# Patient Record
Sex: Male | Born: 1975
Health system: Southern US, Community
[De-identification: ages and names within clinical notes are randomized; demographics above are authoritative.]

## PROBLEM LIST (undated history)

## (undated) DIAGNOSIS — K649 Unspecified hemorrhoids: Secondary | ICD-10-CM

## (undated) DIAGNOSIS — I10 Essential (primary) hypertension: Secondary | ICD-10-CM

## (undated) DIAGNOSIS — L0232 Furuncle of buttock: Secondary | ICD-10-CM

## (undated) DIAGNOSIS — E119 Type 2 diabetes mellitus without complications: Secondary | ICD-10-CM

## (undated) HISTORY — PX: TONSILLECTOMY: SUR1361

---

## 2001-05-18 ENCOUNTER — Emergency Department (HOSPITAL_COMMUNITY): Admission: EM | Admit: 2001-05-18 | Discharge: 2001-05-18 | Payer: Self-pay | Admitting: Emergency Medicine

## 2001-05-18 ENCOUNTER — Encounter: Payer: Self-pay | Admitting: Emergency Medicine

## 2005-09-23 ENCOUNTER — Emergency Department (HOSPITAL_COMMUNITY): Admission: EM | Admit: 2005-09-23 | Discharge: 2005-09-23 | Payer: Self-pay | Admitting: Emergency Medicine

## 2008-05-24 ENCOUNTER — Emergency Department (HOSPITAL_COMMUNITY): Admission: EM | Admit: 2008-05-24 | Discharge: 2008-05-24 | Payer: Self-pay | Admitting: Emergency Medicine

## 2008-05-26 ENCOUNTER — Emergency Department (HOSPITAL_COMMUNITY): Admission: EM | Admit: 2008-05-26 | Discharge: 2008-05-26 | Payer: Self-pay | Admitting: Emergency Medicine

## 2008-05-31 ENCOUNTER — Emergency Department (HOSPITAL_COMMUNITY): Admission: EM | Admit: 2008-05-31 | Discharge: 2008-05-31 | Payer: Self-pay | Admitting: Family Medicine

## 2009-09-18 ENCOUNTER — Emergency Department (HOSPITAL_COMMUNITY): Admission: EM | Admit: 2009-09-18 | Discharge: 2009-09-18 | Payer: Self-pay | Admitting: Emergency Medicine

## 2010-12-22 DIAGNOSIS — L0232 Furuncle of buttock: Secondary | ICD-10-CM

## 2010-12-22 HISTORY — DX: Furuncle of buttock: L02.32

## 2011-09-18 LAB — CBC
HCT: 43.7
Hemoglobin: 15.1
MCHC: 34.5
MCV: 86.1
Platelets: 174
RBC: 5.08
RDW: 13.9
WBC: 10.6 — ABNORMAL HIGH

## 2011-09-18 LAB — DIFFERENTIAL
Basophils Absolute: 0
Basophils Relative: 0
Eosinophils Absolute: 0.3
Eosinophils Relative: 2
Lymphocytes Relative: 19
Lymphs Abs: 2
Monocytes Absolute: 0.5
Monocytes Relative: 4
Neutro Abs: 7.9 — ABNORMAL HIGH
Neutrophils Relative %: 74

## 2011-09-18 LAB — POCT I-STAT, CHEM 8
BUN: 10
Calcium, Ion: 1.19
Chloride: 102
Creatinine, Ser: 1
Glucose, Bld: 460 — ABNORMAL HIGH
HCT: 47
Hemoglobin: 16
Potassium: 4
Sodium: 130 — ABNORMAL LOW
TCO2: 13

## 2011-09-18 LAB — HEMOGLOBIN A1C
Hgb A1c MFr Bld: 17.1 — ABNORMAL HIGH
Mean Plasma Glucose: 531

## 2012-08-13 ENCOUNTER — Other Ambulatory Visit: Payer: Self-pay | Admitting: Family Medicine

## 2012-08-24 ENCOUNTER — Other Ambulatory Visit: Payer: Self-pay | Admitting: Family Medicine

## 2012-11-16 ENCOUNTER — Observation Stay (HOSPITAL_COMMUNITY)
Admission: EM | Admit: 2012-11-16 | Discharge: 2012-11-16 | Disposition: A | Discharge status: Home / Self Care | Payer: Self-pay | Attending: Emergency Medicine | Admitting: Emergency Medicine

## 2012-11-16 ENCOUNTER — Encounter (HOSPITAL_COMMUNITY): Payer: Self-pay | Admitting: Emergency Medicine

## 2012-11-16 DIAGNOSIS — F17200 Nicotine dependence, unspecified, uncomplicated: Secondary | ICD-10-CM | POA: Insufficient documentation

## 2012-11-16 DIAGNOSIS — E1169 Type 2 diabetes mellitus with other specified complication: Principal | ICD-10-CM | POA: Insufficient documentation

## 2012-11-16 DIAGNOSIS — R739 Hyperglycemia, unspecified: Secondary | ICD-10-CM

## 2012-11-16 HISTORY — DX: Type 2 diabetes mellitus without complications: E11.9

## 2012-11-16 LAB — COMPREHENSIVE METABOLIC PANEL
ALT: 20 U/L (ref 0–53)
Albumin: 4.5 g/dL (ref 3.5–5.2)
Alkaline Phosphatase: 75 U/L (ref 39–117)
Potassium: 4.6 mEq/L (ref 3.5–5.1)
Sodium: 130 mEq/L — ABNORMAL LOW (ref 135–145)
Total Protein: 8.3 g/dL (ref 6.0–8.3)

## 2012-11-16 LAB — CBC WITH DIFFERENTIAL/PLATELET
Basophils Absolute: 0.1 10*3/uL (ref 0.0–0.1)
Eosinophils Relative: 1 % (ref 0–5)
HCT: 42 % (ref 39.0–52.0)
Lymphocytes Relative: 33 % (ref 12–46)
MCHC: 34.3 g/dL (ref 30.0–36.0)
MCV: 84.8 fL (ref 78.0–100.0)
Monocytes Absolute: 0.6 10*3/uL (ref 0.1–1.0)
RDW: 13.1 % (ref 11.5–15.5)
WBC: 7.4 10*3/uL (ref 4.0–10.5)

## 2012-11-16 LAB — GLUCOSE, CAPILLARY: Glucose-Capillary: 458 mg/dL — ABNORMAL HIGH (ref 70–99)

## 2012-11-16 MED ORDER — SODIUM CHLORIDE 0.9 % IV SOLN
1000.0000 mL | INTRAVENOUS | Status: DC
  Administered 2012-11-16: 1000 mL via INTRAVENOUS

## 2012-11-16 MED ORDER — INSULIN ASPART 100 UNIT/ML ~~LOC~~ SOLN
7.5000 [IU] | Freq: Once | SUBCUTANEOUS | Status: AC
  Administered 2012-11-16: 7.5 [IU] via INTRAVENOUS
  Filled 2012-11-16: qty 1

## 2012-11-16 MED ORDER — SODIUM CHLORIDE 0.9 % IV SOLN
1000.0000 mL | Freq: Once | INTRAVENOUS | Status: AC
  Administered 2012-11-16: 1000 mL via INTRAVENOUS

## 2012-11-16 MED ORDER — ONDANSETRON HCL 4 MG/2ML IJ SOLN: 4.0000 mg | Freq: Three times a day (TID) | INTRAMUSCULAR | Status: DC | PRN

## 2012-11-16 MED ORDER — METFORMIN HCL 500 MG PO TABS: 500.0000 mg | ORAL_TABLET | Freq: Two times a day (BID) | ORAL | Status: DC

## 2012-11-16 MED ORDER — SODIUM CHLORIDE 0.9 % IV SOLN
INTRAVENOUS | Status: DC
  Filled 2012-11-16: qty 1

## 2012-11-16 NOTE — ED Notes (Signed)
Pt denies any nausea, vomiting or abdominal pain; pt c/o generalized weakness that started about a month ago; pt has not had any diabetic medicine since [facility] closed; pt also reports not having a prescription for metformin and states this is the reason why he is not taking any medicine;

## 2012-11-16 NOTE — ED Notes (Signed)
Report called to Becky, RN

## 2012-11-16 NOTE — ED Notes (Signed)
Patient CBG was 353 Nurse Haig Prophet was informed.

## 2012-11-16 NOTE — ED Notes (Signed)
No answer x1

## 2012-11-16 NOTE — ED Notes (Signed)
Has been out of sugar meds x 1 month states is voiding a lot and is really thirsty cannot wait for insurance to kick in

## 2012-11-16 NOTE — ED Provider Notes (Signed)
History     CSN: [id number]  Arrival date & time 11/16/12  1443   First MD Initiated Contact with Patient 11/16/12 1544      No chief complaint on file.   (Consider location/radiation/quality/duration/timing/severity/associated sxs/prior treatment) HPI  Frederick Grant is a 36 y.o. male complaining of polyuria,polydipsia and dehydration worsening over the course of the last 2 weeks. Patient has been out of his metformin for one month. He does not have a prescription and was a [facility] patient. Patient denies any abdominal pain, nausea/vomiting, chest pain, shortness of breath, palpitations.  Past Medical History  Diagnosis Date  . Diabetes mellitus without complication     No past surgical history on file.  No family history on file.  History  Substance Use Topics  . Smoking status: Current Every Day Smoker  . Smokeless tobacco: Not on file  . Alcohol Use: Yes      Review of Systems  Constitutional: Negative for fever.  Respiratory: Negative for shortness of breath.   Cardiovascular: Negative for chest pain.  Gastrointestinal: Negative for nausea, vomiting, abdominal pain and diarrhea.  Genitourinary: Positive for frequency.  All other systems reviewed and are negative.    Allergies  Review of patient's allergies indicates no known allergies.  Home Medications  No current outpatient prescriptions on file.  BP 142/92  Pulse 113  Temp 98.6 F (37 C)  Resp 16  SpO2 99%  Physical Exam  Nursing note and vitals reviewed. Constitutional: He is oriented to person, place, and time. He appears well-developed and well-nourished. No distress.       Obese  HENT:  Head: Normocephalic.       Dry mucous membrane  Eyes: Conjunctivae normal and EOM are normal.  Cardiovascular: Normal rate.        Tachycardia in the 110s no murmurs rubs or gallops.  Pulmonary/Chest: Effort normal and breath sounds normal. No stridor. No respiratory distress. He has no wheezes. He  has no rales. He exhibits no tenderness.  Abdominal: Soft. Bowel sounds are normal. He exhibits no distension and no mass. There is no tenderness. There is no rebound and no guarding.  Musculoskeletal: Normal range of motion. He exhibits no edema.  Neurological: He is alert and oriented to person, place, and time.  Psychiatric: He has a normal mood and affect.    ED Course  Procedures (including critical care time)  Labs Reviewed  COMPREHENSIVE METABOLIC PANEL - Abnormal; Notable for the following:    Sodium 130 (*)     Chloride 93 (*)     Glucose, Bld 470 (*)     GFR calc non Af Amer 88 (*)     All other components within normal limits  GLUCOSE, CAPILLARY - Abnormal; Notable for the following:    Glucose-Capillary 458 (*)     All other components within normal limits  CBC WITH DIFFERENTIAL   No results found.   1. Hyperglycemia without ketosis       MDM  Hyperglycemia with CBG of 470 normal gap of 13. Physical exam is normal and patient has no other complaints at this time. Patient will be started on glucose stabilizer protocol and transferred to the CDU. Report given to NP. Smith.           Wynetta Emery, PA-C 11/16/12 2111

## 2012-11-16 NOTE — ED Provider Notes (Signed)
Patient moved to CDU pending completion of treatment of hyperglycemia.  Patient feeling better after initial fluid bolus with decreased in blood glucose to 289.  Patient fed and carbohydrates covered with insulin.  Patient ran out of his metformin about one month ago (was [facility], has not been able to arrange for prescription refill.  Patient states his insurance will become effective at the first of the new year.  Patient provided a prescription for metformin 500 mg bid, with one refill.    Jimmye Norman, NP 11/16/12 [phone]

## 2012-11-16 NOTE — ED Provider Notes (Signed)
Medical screening examination/treatment/procedure(s) were performed by non-physician practitioner and as supervising physician I was immediately available for consultation/collaboration.  Rogene Meth, MD 11/16/12 2131 

## 2012-11-16 NOTE — ED Notes (Signed)
PA at bedside.

## 2012-11-16 NOTE — ED Notes (Addendum)
Pt A.O.x  4. Vitals stable. NAD. Resp WNL. Skin warm and dry.  Denies pain.  Ambulatory. Steady gait with no assist. Family at bedside. Verbalized need to full prescription. Verbalized understanding of medication administration. No further needs at this time.

## 2012-11-17 NOTE — ED Provider Notes (Signed)
Medical screening examination/treatment/procedure(s) were performed by non-physician practitioner and as supervising physician I was immediately available for consultation/collaboration.  Geoffery Lyons, MD 11/17/12 [phone]

## 2013-07-13 ENCOUNTER — Encounter (HOSPITAL_COMMUNITY): Payer: Self-pay

## 2013-07-13 ENCOUNTER — Emergency Department (HOSPITAL_COMMUNITY)
Admission: EM | Admit: 2013-07-13 | Discharge: 2013-07-13 | Disposition: A | Discharge status: Home / Self Care | Payer: BC Managed Care – PPO | Source: Home / Self Care | Attending: Family Medicine | Admitting: Family Medicine

## 2013-07-13 DIAGNOSIS — E119 Type 2 diabetes mellitus without complications: Secondary | ICD-10-CM

## 2013-07-13 MED ORDER — METFORMIN HCL 1000 MG PO TABS: 1000.0000 mg | ORAL_TABLET | Freq: Two times a day (BID) | ORAL | Status: DC

## 2013-07-13 NOTE — ED Notes (Signed)
Chart review.

## 2013-07-13 NOTE — ED Notes (Signed)
Out of metformin, needs refill

## 2013-07-13 NOTE — ED Provider Notes (Signed)
   History    CSN: [id number] Arrival date & time 07/13/13  1150  First MD Initiated Contact with Patient 07/13/13 1317     Chief Complaint  Patient presents with  . Medication Refill   (Consider location/radiation/quality/duration/timing/severity/associated sxs/prior Treatment) Patient is a 37 y.o. male presenting with hyperglycemia. The history is provided by the patient.  Hyperglycemia Severity:  Mild Chronicity:  Chronic Diabetes status:  Controlled with oral medications Current diabetic therapy:  Metformin out for 2 wks. Context: noncompliance   Risk factors: obesity    Past Medical History  Diagnosis Date  . Diabetes mellitus without complication    History reviewed. No pertinent past surgical history. History reviewed. No pertinent family history. History  Substance Use Topics  . Smoking status: Current Every Day Smoker  . Smokeless tobacco: Not on file  . Alcohol Use: Yes    Review of Systems  Constitutional: Negative.     Allergies  Review of patient's allergies indicates no known allergies.  Home Medications   Current Outpatient Rx  Name  Route  Sig  Dispense  Refill  . metFORMIN (GLUCOPHAGE) 1000 MG tablet   Oral   Take 1 tablet (1,000 mg total) by mouth 2 (two) times daily.   60 tablet   0   . metFORMIN (GLUCOPHAGE) 500 MG tablet   Oral   Take 1 tablet (500 mg total) by mouth 2 (two) times daily with a meal.   60 tablet   1    BP 145/97  Pulse 98  Temp(Src) 98.1 F (36.7 C) (Oral)  Resp 20  SpO2 95% Physical Exam  Nursing note and vitals reviewed. Constitutional: He is oriented to person, place, and time. He appears well-developed and well-nourished.  Neck: Normal range of motion. Neck supple.  Cardiovascular: Normal rate, normal heart sounds and intact distal pulses.   Pulmonary/Chest: Effort normal and breath sounds normal.  Neurological: He is alert and oriented to person, place, and time.  Skin: Skin is warm and dry.    ED  Course  Procedures (including critical care time) Labs Reviewed  GLUCOSE, CAPILLARY - Abnormal; Notable for the following:    Glucose-Capillary 489 (*)    All other components within normal limits   No results found. 1. Diabetes mellitus     MDM  [id number]  Linna Hoff, MD 07/13/13 1339

## 2013-08-04 DIAGNOSIS — I10 Essential (primary) hypertension: Secondary | ICD-10-CM | POA: Insufficient documentation

## 2013-08-04 DIAGNOSIS — E119 Type 2 diabetes mellitus without complications: Secondary | ICD-10-CM | POA: Insufficient documentation

## 2014-02-06 DIAGNOSIS — E785 Hyperlipidemia, unspecified: Secondary | ICD-10-CM | POA: Insufficient documentation

## 2014-05-04 ENCOUNTER — Encounter (HOSPITAL_COMMUNITY): Payer: Self-pay | Admitting: Emergency Medicine

## 2014-05-04 ENCOUNTER — Emergency Department (HOSPITAL_COMMUNITY)
Admission: EM | Admit: 2014-05-04 | Discharge: 2014-05-04 | Disposition: A | Discharge status: Home / Self Care | Payer: BC Managed Care – PPO | Source: Home / Self Care | Attending: Family Medicine | Admitting: Family Medicine

## 2014-05-04 DIAGNOSIS — S61409A Unspecified open wound of unspecified hand, initial encounter: Secondary | ICD-10-CM

## 2014-05-04 DIAGNOSIS — L089 Local infection of the skin and subcutaneous tissue, unspecified: Secondary | ICD-10-CM

## 2014-05-04 DIAGNOSIS — S61452A Open bite of left hand, initial encounter: Principal | ICD-10-CM

## 2014-05-04 DIAGNOSIS — W540XXA Bitten by dog, initial encounter: Secondary | ICD-10-CM

## 2014-05-04 HISTORY — DX: Essential (primary) hypertension: I10

## 2014-05-04 MED ORDER — CEFTRIAXONE SODIUM 250 MG IJ SOLR
1000.0000 mg | Freq: Once | INTRAMUSCULAR | Status: AC
  Administered 2014-05-04: 1000 mg via INTRAMUSCULAR

## 2014-05-04 MED ORDER — TETANUS-DIPHTH-ACELL PERTUSSIS 5-2.5-18.5 LF-MCG/0.5 IM SUSP: 0.5000 mL | Freq: Once | INTRAMUSCULAR | Status: DC

## 2014-05-04 MED ORDER — AMOXICILLIN-POT CLAVULANATE 875-125 MG PO TABS: 1.0000 | ORAL_TABLET | Freq: Two times a day (BID) | ORAL | Status: DC

## 2014-05-04 MED ORDER — TETANUS-DIPHTH-ACELL PERTUSSIS 5-2.5-18.5 LF-MCG/0.5 IM SUSP
INTRAMUSCULAR | Status: AC
  Filled 2014-05-04: qty 0.5

## 2014-05-04 MED ORDER — CEFTRIAXONE SODIUM 1 G IJ SOLR
INTRAMUSCULAR | Status: AC
  Filled 2014-05-04: qty 10

## 2014-05-04 NOTE — ED Provider Notes (Signed)
CSN: [id number]     Arrival date & time 05/04/14  0827 History   First MD Initiated Contact with Patient 05/04/14 [phone]     Chief Complaint  Patient presents with  . Animal Bite   (Consider location/radiation/quality/duration/timing/severity/associated sxs/prior Treatment) HPI Comments: PCP: Nadyne CoombesKaren Richter  Patient is a 38 y.o. male presenting with animal bite. The history is provided by the patient.  Animal Bite Contact animal:  Dog Location:  Hand Hand injury location:  L hand Time since incident:  1 day (+occurred yesterday midday) Incident location:  Home Notification: [facility] Animal's rabies vaccination status:  Up to date Animal in possession: yes   Tetanus status:  Up to date (had booster in 2014) Associated symptoms: swelling   Associated symptoms: no fever, no numbness and no rash   Associated symptoms comment:  +progressive swelling and redness over past 24 hours   Past Medical History  Diagnosis Date  . Diabetes mellitus without complication   . Hypertension    History reviewed. No pertinent past surgical history. History reviewed. No pertinent family history. History  Substance Use Topics  . Smoking status: Current Every Day Smoker  . Smokeless tobacco: Not on file  . Alcohol Use: Yes    Review of Systems  Constitutional: Negative for fever.  Skin: Negative for rash.  Neurological: Negative for numbness.  All other systems reviewed and are negative.   Allergies  Review of patient's allergies indicates no known allergies.  Home Medications   Prior to Admission medications   Medication Sig Start Date End Date Taking? Authorizing Provider  LISINOPRIL PO Take by mouth.   Yes Historical Provider, MD  amoxicillin-clavulanate (AUGMENTIN) 875-125 MG per tablet Take 1 tablet by mouth every 12 (twelve) hours. 05/04/14   Ardis RowanJennifer Lee Ripley Lovecchio, PA  metFORMIN (GLUCOPHAGE) 1000 MG tablet Take 1 tablet (1,000 mg total) by mouth 2 (two)  times daily. 07/13/13   Linna HoffJames D Kindl, MD  metFORMIN (GLUCOPHAGE) 500 MG tablet Take 1 tablet (500 mg total) by mouth 2 (two) times daily with a meal. 11/16/12   Jimmye Normanavid John Smith, NP   BP 166/99  Pulse 90  Temp(Src) 98.8 F (37.1 C) (Oral)  Resp 20  SpO2 100% Physical Exam  Nursing note and vitals reviewed. Constitutional: He is oriented to person, place, and time. He appears well-developed and well-nourished. No distress.  HENT:  Head: Normocephalic and atraumatic.  Eyes: Conjunctivae are normal. No scleral icterus.  Cardiovascular: Normal rate.   Pulmonary/Chest: Effort normal.  Musculoskeletal: Normal range of motion.  Neurological: He is alert and oriented to person, place, and time.  Skin: Skin is warm and dry.  1.5 cm SQ linear laceration over palmar surface at base of left thumb.    Psychiatric: He has a normal mood and affect. His behavior is normal.    ED Course  Procedures (including critical care time) Labs Review Labs Reviewed - No data to display  Imaging Review No results found.   MDM   1. Dog bite of left hand with infection   [facility] Given current signs of infection and that laceration occurred > 12 hours PTA, wound left open to heal by secondary intention. [facility] and patient instructed regarding wound care at home. Will be treated at home with Augmentin and [facility] days.  Jess BartersJennifer Lee ShagelukPresson, GeorgiaPA 05/04/14 1134

## 2014-05-04 NOTE — ED Provider Notes (Signed)
Medical screening examination/treatment/procedure(s) were performed by a resident physician or non-physician practitioner and as the supervising physician I was immediately available for consultation/collaboration.  Clementeen GrahamEvan Cheridan Kibler, MD    Rodolph BongEvan S Mirtha Jain, MD 05/04/14 [phone]

## 2014-05-04 NOTE — ED Notes (Signed)
Pt  Was bitten  l  Hand  By  His  Dog  Yesterday      While  Untangling  His  Leash         He  States the  Dog is  Chained  And  Is  utd  On his  Immunizations

## 2014-05-04 NOTE — Discharge Instructions (Signed)
Please wash wound 2 x day with warm soapy water, pat dry and keep covered during daytime with clean, dry dressing. Leave open to the air at night while sleeping. Medications as directed for infection and [facility]  Animal Bite An animal bite can result in a scratch on the skin, deep open cut, puncture of the skin, crush injury, or tearing away of the skin or a body part. Dogs are responsible for most animal bites. Children are bitten more often than adults. An animal bite can range from very mild to more serious. A small bite from your house pet is no cause for alarm. However, some animal bites can become infected or injure a bone or other tissue. You must seek medical care if:  The skin is broken and bleeding does not slow down or stop after 15 minutes.  The puncture is deep and difficult to clean (such as a cat bite).  Pain, warmth, redness, or pus develops around the wound.  The bite is from a stray animal or rodent. There may be a risk of rabies infection.  The bite is from a snake, raccoon, skunk, fox, coyote, or bat. There may be a risk of rabies infection.  The person bitten has a chronic illness such as diabetes, liver disease, or cancer, or the person takes medicine that lowers the immune system.  There is concern about the location and severity of the bite. It is important to clean and protect an animal bite wound right away to prevent infection. Follow these steps:  Clean the wound with plenty of water and soap.  Apply an antibiotic cream.  Apply gentle pressure over the wound with a clean towel or gauze to slow or stop bleeding.  Elevate the affected area above the heart to help stop any bleeding.  Seek medical care. Getting medical care within 8 hours of the animal bite leads to the best possible outcome. DIAGNOSIS  Your caregiver will most likely:  Take a detailed history of the animal and the bite injury.  Perform a wound exam.  Take  your medical history. Blood tests or X-rays may be performed. Sometimes, infected bite wounds are cultured and sent to a lab to identify the infectious bacteria.  TREATMENT  Medical treatment will depend on the location and type of animal bite as well as the patient's medical history. Treatment may include:  Wound care, such as cleaning and flushing the wound with saline solution, bandaging, and elevating the affected area.  Antibiotics.  Tetanus immunization.  Rabies immunization.  Leaving the wound open to heal. This is often done with animal bites, due to the high risk of infection. However, in certain cases, wound closure with stitches, wound adhesive, skin adhesive strips, or staples may be used. Infected bites that are left untreated may require intravenous (IV) antibiotics and surgical treatment in the hospital. HOME CARE INSTRUCTIONS  Follow your caregiver's instructions for wound care.  Take all medicines as directed.  If your caregiver prescribes antibiotics, take them as directed. Finish them even if you start to feel better.  Follow up with your caregiver for further exams or immunizations as directed. You may need a tetanus shot if:  You cannot remember when you had your last tetanus shot.  You have never had a tetanus shot.  The injury broke your skin. If you get a tetanus shot, your arm may swell, get red, and feel warm to the touch. This is common and  not a problem. If you need a tetanus shot and you choose not to have one, there is a rare chance of getting tetanus. Sickness from tetanus can be serious. SEEK MEDICAL CARE IF:  You notice warmth, redness, soreness, swelling, pus discharge, or a bad smell coming from the wound.  You have a red line on the skin coming from the wound.  You have a fever, chills, or a general ill feeling.  You have nausea or vomiting.  You have continued or worsening pain.  You have trouble moving the injured part.  You have  other questions or concerns. MAKE SURE YOU:  Understand these instructions.  Will watch your condition.  Will get help right away if you are not doing well or get worse. Document Released: 08/26/2011 Document Revised: 03/01/2012 Document Reviewed: 08/26/2011 [facility] Patient Information 2014 [place] [place].

## 2015-06-28 ENCOUNTER — Emergency Department (INDEPENDENT_AMBULATORY_CARE_PROVIDER_SITE_OTHER)
Admission: EM | Admit: 2015-06-28 | Discharge: 2015-06-28 | Disposition: A | Discharge status: Home / Self Care | Payer: Self-pay | Source: Home / Self Care | Attending: Family Medicine | Admitting: Family Medicine

## 2015-06-28 ENCOUNTER — Encounter (HOSPITAL_COMMUNITY): Payer: Self-pay | Admitting: Emergency Medicine

## 2015-06-28 DIAGNOSIS — K029 Dental caries, unspecified: Secondary | ICD-10-CM

## 2015-06-28 MED ORDER — CLINDAMYCIN HCL 300 MG PO CAPS: 300.0000 mg | ORAL_CAPSULE | Freq: Three times a day (TID) | ORAL | Status: DC

## 2015-06-28 MED ORDER — DICLOFENAC POTASSIUM 50 MG PO TABS: 50.0000 mg | ORAL_TABLET | Freq: Three times a day (TID) | ORAL | Status: DC

## 2015-06-28 NOTE — ED Notes (Signed)
Pt comes in with c/o ride side dental pain x 2 weeks otc medication not working

## 2015-06-28 NOTE — ED Provider Notes (Addendum)
CSN: [id number]     Arrival date & time 06/28/15  1323 History   First MD Initiated Contact with Patient 06/28/15 1345     Chief Complaint  Patient presents with  . Dental Pain   (Consider location/radiation/quality/duration/timing/severity/associated sxs/prior Treatment) Patient is a 39 y.o. male presenting with tooth pain. The history is provided by the patient and the spouse.  Dental Pain Location:  Lower Lower teeth location:  31/RL 2nd molar Quality:  Throbbing Severity:  Moderate Onset quality:  Gradual Duration:  2 days Timing:  Constant Progression:  Worsening Chronicity:  New Context: dental caries, dental fracture and poor dentition   Relieved by:  None tried Worsened by:  Nothing tried Associated symptoms: facial pain and gum swelling   Associated symptoms: no facial swelling and no fever   Risk factors: alcohol problem, lack of dental care, periodontal disease and smoking     Past Medical History  Diagnosis Date  . Diabetes mellitus without complication   . Hypertension    History reviewed. No pertinent past surgical history. No family history on file. History  Substance Use Topics  . Smoking status: Current Every Day Smoker  . Smokeless tobacco: Not on file  . Alcohol Use: Yes    Review of Systems  Constitutional: Negative.  Negative for fever.  HENT: Positive for dental problem. Negative for facial swelling.     Allergies  Review of patient's allergies indicates no known allergies.  Home Medications   Prior to Admission medications   Medication Sig Start Date End Date Taking? Authorizing Provider  amoxicillin-clavulanate (AUGMENTIN) 875-125 MG per tablet Take 1 tablet by mouth every 12 (twelve) hours. 05/04/14   Mathis FareJennifer Lee H Presson, PA  clindamycin (CLEOCIN) 300 MG capsule Take 1 capsule (300 mg total) by mouth 3 (three) times daily. 06/28/15   Linna HoffJames D Kindl, MD  diclofenac (CATAFLAM) 50 MG tablet Take 1 tablet (50 mg total) by mouth 3 (three) times  daily. For dental pain 06/28/15   Linna HoffJames D Kindl, MD  LISINOPRIL PO Take by mouth.    Historical Provider, MD  metFORMIN (GLUCOPHAGE) 1000 MG tablet Take 1 tablet (1,000 mg total) by mouth 2 (two) times daily. 07/13/13   Linna HoffJames D Kindl, MD  metFORMIN (GLUCOPHAGE) 500 MG tablet Take 1 tablet (500 mg total) by mouth 2 (two) times daily with a meal. 11/16/12   Felicie Mornavid Smith, NP   BP 182/105 mmHg  Pulse 81  Temp(Src) 97.6 F (36.4 C) (Oral)  Resp 18  SpO2 100% Physical Exam  Constitutional: He is oriented to person, place, and time. He appears well-developed and well-nourished. No distress.  HENT:  Mouth/Throat: Uvula is midline, oropharynx is clear and moist and mucous membranes are normal. Abnormal dentition. Dental abscesses and dental caries present.    Neck: Normal range of motion. Neck supple.  Lymphadenopathy:    He has cervical adenopathy.  Neurological: He is alert and oriented to person, place, and time.  Skin: Skin is warm and dry.  Nursing note and vitals reviewed.   ED Course  Procedures (including critical care time) Labs Review Labs Reviewed - No data to display  Imaging Review No results found.   MDM   1. Pain due to dental caries        Linna HoffJames D Kindl, MD 06/28/15 2149  Linna HoffJames D Kindl, MD 06/28/15 2149

## 2015-06-28 NOTE — Discharge Instructions (Signed)
Take medicine as prescribed, see your dentist as soon as possible °

## 2015-07-23 ENCOUNTER — Ambulatory Visit (INDEPENDENT_AMBULATORY_CARE_PROVIDER_SITE_OTHER): Payer: 59 | Admitting: Physician Assistant

## 2015-07-23 VITALS — BP 146/100 | HR 90 | Temp 98.6°F | Resp 18 | Ht 68.0 in | Wt 279.2 lb

## 2015-07-23 DIAGNOSIS — R030 Elevated blood-pressure reading, without diagnosis of hypertension: Secondary | ICD-10-CM | POA: Diagnosis not present

## 2015-07-23 DIAGNOSIS — K611 Rectal abscess: Secondary | ICD-10-CM

## 2015-07-23 DIAGNOSIS — IMO0001 Reserved for inherently not codable concepts without codable children: Secondary | ICD-10-CM

## 2015-07-23 MED ORDER — AMOXICILLIN-POT CLAVULANATE 875-125 MG PO TABS: 1.0000 | ORAL_TABLET | Freq: Two times a day (BID) | ORAL | Status: DC

## 2015-07-23 NOTE — Progress Notes (Signed)
   07/24/2015 at 1:54 PM  Lorayne Marek / DOB: 1976-07-23 / MRN: [id number]  The patient  does not have a problem list on file.  SUBJECTIVE  Chief complaint: Abscess  Frederick Grant is a well appearing male presenting with a medical history of hypertension now unmedicated for right sided abscess at the 3 o'clock position. Reports it is very tender, worse with sitting.  Has not tried any meds.  He has had abscesses in this area before.  Has never had a colonoscopy.   Denies fever, chills, nausea.   He  has a past medical history of Diabetes mellitus without complication and Hypertension.    Medications reviewed and updated by myself where necessary, and exist elsewhere in the encounter.   Mr. Lucken has No Known Allergies. He  reports that he has been smoking.  He does not have any smokeless tobacco history on file. He reports that he drinks alcohol. He  has no sexual activity history on file. The patient  has no past surgical history on file.  His family history includes Diabetes in his mother.  ROS  Per HPI  OBJECTIVE  His  height is  (1.727 m) and weight is 279 lb 3.2 oz (126.644 kg). His oral temperature is 98.6 F (37 C). His blood pressure is 146/100 and his pulse is 90. His respiration is 18 and oxygen saturation is 98%.  The patient's body mass index is 42.46 kg/(m^2).  Physical Exam  Vitals reviewed. Constitutional: He is oriented to person, place, and time. He appears well-developed and well-nourished.  Cardiovascular: Normal rate.   Respiratory: Effort normal.  GI: He exhibits no distension.  Musculoskeletal: Normal range of motion.  Neurological: He is alert and oriented to person, place, and time. No cranial nerve deficit. Coordination normal.  Skin: Skin is warm and dry.  Psychiatric: He has a normal mood and affect.    No results found for this or any previous visit (from the past 24 hour(s)).  ASSESSMENT & PLAN  [place] was seen today for  abscess.  Diagnoses and all orders for this visit:  Perirectal abscess Orders: -     amoxicillin-clavulanate (AUGMENTIN) 875-125 MG per tablet; Take 1 tablet by mouth 2 (two) times daily. -     Wound culture  Elevated BP: Advised patient to return in 48 hours for wound check and will, at that time, will likely start a dual agent for BP control.     The patient was advised to call or come back to clinic if he does not see an improvement in symptoms, or worsens with the above plan.   Deliah Boston, MHS, PA-C [facility] 07/24/2015 1:54 PM

## 2015-07-25 ENCOUNTER — Ambulatory Visit (INDEPENDENT_AMBULATORY_CARE_PROVIDER_SITE_OTHER): Payer: 59 | Admitting: Physician Assistant

## 2015-07-25 VITALS — BP 134/85 | HR 103 | Temp 98.9°F | Ht 68.0 in | Wt 281.2 lb

## 2015-07-25 DIAGNOSIS — K611 Rectal abscess: Secondary | ICD-10-CM

## 2015-07-25 DIAGNOSIS — Z6841 Body Mass Index (BMI) 40.0 and over, adult: Secondary | ICD-10-CM | POA: Insufficient documentation

## 2015-07-25 NOTE — Progress Notes (Signed)
Chief Complaint  Patient presents with  . Wound Care    History of Present Illness: Patient presents for wound care. Abscess on the RIGHT buttock, adjacent to the anus, was I&D'd 2 days ago.  Doing much better. Tolerating augmentin without adverse effects. No fever, chills. His wife, an Charity fundraiser who now works on [employer] team, is changing the dressings and he indicates that he's had a lot of drainage.  Wound culture has been re-incubated for better growth.  No Known Allergies  Prior to Admission medications   Medication Sig Start Date End Date Taking? Authorizing Provider  amoxicillin-clavulanate (AUGMENTIN) 875-125 MG per tablet Take 1 tablet by mouth 2 (two) times daily. 07/23/15  Yes Ofilia Neas, PA-C    Patient Active Problem List   Diagnosis Date Noted  . BMI 40.0-44.9, adult 07/25/2015  . Hyperlipidemia 02/06/2014  . Diabetes mellitus, type 2 08/04/2013  . HTN (hypertension) 08/04/2013     Physical Exam  Constitutional: He is oriented to person, place, and time. He appears well-developed and well-nourished. He is active and cooperative. No distress.  BP 134/85 mmHg  Pulse 103  Temp(Src) 98.9 F (37.2 C) (Oral)  Ht  (1.727 m)  Wt 281 lb 4 oz (127.574 kg)  BMI 42.77 kg/m2  SpO2 98%   Eyes: Conjunctivae are normal.  Pulmonary/Chest: Effort normal.  Genitourinary: Rectum normal.     Neurological: He is alert and oriented to person, place, and time.  Psychiatric: He has a normal mood and affect. His speech is normal and behavior is normal.      ASSESSMENT & PLAN:  1. Perirectal abscess Continue antibiotic. Warm compresses. Dressing changes as needed. RTC 2 days for wound care.  Fernande Bras, PA-C Physician Assistant-Certified [facility]

## 2015-07-25 NOTE — Patient Instructions (Signed)
Continue the antibiotic as prescribed. Apply a warm compress to the area for 15-20 minutes 2-4 times each day. Change the dressing if it becomes saturated, leaks or falls off.  

## 2015-07-26 LAB — WOUND CULTURE
Gram Stain: NONE SEEN
Gram Stain: NONE SEEN
Gram Stain: NONE SEEN

## 2015-08-01 ENCOUNTER — Ambulatory Visit (INDEPENDENT_AMBULATORY_CARE_PROVIDER_SITE_OTHER): Payer: 59 | Admitting: Urgent Care

## 2015-08-01 ENCOUNTER — Encounter: Payer: Self-pay | Admitting: Urgent Care

## 2015-08-01 VITALS — BP 134/84 | HR 89 | Temp 98.5°F | Resp 18 | Ht 68.0 in | Wt 278.4 lb

## 2015-08-01 DIAGNOSIS — K611 Rectal abscess: Secondary | ICD-10-CM

## 2015-08-01 NOTE — Progress Notes (Signed)
    MRN: [id number] DOB: [birth year]  Subjective:   Frederick Grant is a 39 y.o. male presenting for follow up on abscess s/p I&D. Reports significant improvement. Denies fever, redness, drainage pus or bleeding, swelling. Patient has continued to take Augmentin, has one day left. Reports that the packing fell out yesterday, patient has been doing daily dressing changes. Denies any other aggravating or relieving factors, no other questions or concerns.  Frederick Grant has a current medication list which includes the following prescription(s): amoxicillin-clavulanate. Also has No Known Allergies.  Frederick Grant  has a past medical history of Diabetes mellitus without complication and Hypertension. Also  has no past surgical history on file.  Objective:   Vitals: BP 134/84 mmHg  Pulse 89  Temp(Src) 98.5 F (36.9 C) (Oral)  Resp 18  Ht  (1.727 m)  Wt 278 lb 6.4 oz (126.281 kg)  BMI 42.34 kg/m2  SpO2 98%  Physical Exam  Genitourinary:       WOUND CARE: Abscess of buttock s/p I&D Dressing removed, no packing present. Wound expressed without drainage of pus. Cleansed and dressed.  Assessment and Plan :   1. Perirectal abscess - Healing very well, anticipatory guidance provided. Continue daily dressing changes. Return to clinic if this problem doesn't completely resolve.  Wallis Bamberg, PA-C [facility] [phone] 08/01/2015 8:14 PM

## 2015-08-01 NOTE — Patient Instructions (Signed)
Please finish antibiotic course. Continue daily dressing changes. Return to clinic if this problem doesn't completely resolve.

## 2015-08-04 NOTE — Progress Notes (Signed)
  Medical screening examination/treatment/procedure(s) were performed by non-physician practitioner and as supervising physician I was immediately available for consultation/collaboration.     

## 2015-09-11 ENCOUNTER — Ambulatory Visit (INDEPENDENT_AMBULATORY_CARE_PROVIDER_SITE_OTHER): Payer: 59 | Admitting: Family Medicine

## 2015-09-11 ENCOUNTER — Telehealth: Payer: Self-pay

## 2015-09-11 VITALS — BP 132/90 | HR 88 | Temp 98.4°F | Resp 18 | Ht 67.5 in | Wt 281.0 lb

## 2015-09-11 DIAGNOSIS — E119 Type 2 diabetes mellitus without complications: Secondary | ICD-10-CM | POA: Diagnosis not present

## 2015-09-11 DIAGNOSIS — I10 Essential (primary) hypertension: Secondary | ICD-10-CM

## 2015-09-11 DIAGNOSIS — E785 Hyperlipidemia, unspecified: Secondary | ICD-10-CM

## 2015-09-11 LAB — COMPREHENSIVE METABOLIC PANEL
ALBUMIN: 4.6 g/dL (ref 3.6–5.1)
ALT: 21 U/L (ref 9–46)
AST: 16 U/L (ref 10–40)
Alkaline Phosphatase: 53 U/L (ref 40–115)
BUN: 14 mg/dL (ref 7–25)
CALCIUM: 9.2 mg/dL (ref 8.6–10.3)
CHLORIDE: 98 mmol/L (ref 98–110)
CO2: 27 mmol/L (ref 20–31)
Creat: 0.8 mg/dL (ref 0.60–1.35)
Glucose, Bld: 261 mg/dL — ABNORMAL HIGH (ref 65–99)
POTASSIUM: 4.6 mmol/L (ref 3.5–5.3)
Sodium: 134 mmol/L — ABNORMAL LOW (ref 135–146)
TOTAL PROTEIN: 7.3 g/dL (ref 6.1–8.1)
Total Bilirubin: 0.5 mg/dL (ref 0.2–1.2)

## 2015-09-11 LAB — LIPID PANEL
CHOL/HDL RATIO: 5.2 ratio — AB (ref <=5.0)
CHOLESTEROL: 245 mg/dL — AB (ref 125–200)
HDL: 47 mg/dL (ref >=40)
LDL Cholesterol: 166 mg/dL — ABNORMAL HIGH (ref <130)
TRIGLYCERIDES: 159 mg/dL — AB (ref <150)
VLDL: 32 mg/dL — AB (ref <30)

## 2015-09-11 LAB — GLUCOSE, POCT (MANUAL RESULT ENTRY): POC Glucose: 278 mg/dl — AB (ref 70–99)

## 2015-09-11 LAB — POCT GLYCOSYLATED HEMOGLOBIN (HGB A1C): HEMOGLOBIN A1C: 11.9

## 2015-09-11 MED ORDER — METFORMIN HCL ER 500 MG PO TB24: ORAL_TABLET | ORAL | Status: DC

## 2015-09-11 MED ORDER — LISINOPRIL 5 MG PO TABS: 5.0000 mg | ORAL_TABLET | Freq: Every day | ORAL | Status: AC

## 2015-09-11 MED ORDER — METFORMIN HCL ER (MOD) 500 MG PO TB24: 500.0000 mg | ORAL_TABLET | Freq: Every day | ORAL | Status: DC

## 2015-09-11 MED ORDER — BLOOD GLUCOSE METER KIT: PACK | Status: AC

## 2015-09-11 MED ORDER — GLIMEPIRIDE 2 MG PO TABS: 2.0000 mg | ORAL_TABLET | Freq: Every day | ORAL | Status: DC

## 2015-09-11 NOTE — Progress Notes (Signed)
Patient ID: Frederick Grant, male    DOB: [birth year]  Age: 39 y.o. MRN: [id number]  Chief Complaint  Patient presents with  . Diabetes    here for diabetes check; pt has metformin but does not take it because of the side effects    Subjective:   39 year old man who works at [employer] on [employer] [employer]. He recently got married to a lady who is a Engineer, civil (consulting) at the [employer] system working with [employer]. He has been diagnosed several years ago with having diabetes. He used to see Dr. Hal Hope who no longer seems to be practicing. He was on some metformin but didn't tolerate it well with GI symptoms so he quit taking it. He has not been serious about diet or medicines. He has never gone to class or red up on diabetes. He has been obese for years. He played football in high school, has been heavy since then. He does not have any other major acute symptoms. Review of systems is essentially unremarkable. He checked her blood sugar at home and it was over 300 today. He has not been feeling well, just generalized malaise. He does not get any regular exercise. He does smoke. I did not have a big discussion about that with him. Current allergies, medications, problem list, past/family and social histories reviewed.  Objective:  BP 132/90 mmHg  Pulse 88  Temp(Src) 98.4 F (36.9 C) (Oral)  Resp 18  Ht 5' 7.5" (1.715 m)  Wt 281 lb (127.461 kg)  BMI 43.34 kg/m2  SpO2 98%  Obese. TMs normal. Throat clear. Neck supple without nodes. Chest clear. Heart regular without murmurs.  Assessment & Plan:   Assessment: 1. Type 2 diabetes mellitus without complication   2. Essential hypertension   3. Hyperlipidemia       Plan: Orders Placed This Encounter  Procedures  . Comprehensive metabolic panel  . Lipid panel  . POCT glucose (manual entry)  . POCT glycosylated hemoglobin (Hb A1C)    Meds ordered this encounter  Medications  . DISCONTD: metFORMIN (GLUCOPHAGE) 500 MG tablet    Sig: Take  500 mg by mouth 2 (two) times daily with a meal.  . glimepiride (AMARYL) 2 MG tablet    Sig: Take 1 tablet (2 mg total) by mouth daily before breakfast.    Dispense:  30 tablet    Refill:  3  . metFORMIN (GLUMETZA) 500 MG (MOD) 24 hr tablet    Sig: Take 1 tablet (500 mg total) by mouth daily with breakfast.    Dispense:  60 tablet    Refill:  3  . lisinopril (PRINIVIL,ZESTRIL) 5 MG tablet    Sig: Take 1 tablet (5 mg total) by mouth daily.    Dispense:  90 tablet    Refill:  3     We held a long discussion, over 35 minutes of talking about his general health needs and diabetes. His wife was present. He agrees that he needs to make some changes. Goals set of trying to lose 25 pounds between now and the end of the year.  Entire time with patient almost 1 hour.  Results for orders placed or performed in visit on 09/11/15  POCT glucose (manual entry)  Result Value Ref Range   POC Glucose 278 (A) 70 - 99 mg/dl  POCT glycosylated hemoglobin (Hb A1C)  Result Value Ref Range   Hemoglobin A1C 11.9       Patient Instructions  Read the American Diabetic Association website  online  Work hard on [employer] intake as discussed.  Get regular exercise as discussed  Take metformin ER 500 mg one half daily for 3 days, then twice daily. After 1-2 weeks increase to 500 mg twice daily if tolerated.  Take Amaryl (glimepiride) 2 mg one daily at breakfast  Monitor your blood sugar before breakfast and 2 hours after your main meal several times a week. Keep a long [place] your blood sugars and bring them in when you come in. You can find website applications wear you can keep a record on line on your phone if you wish.  Take lisinopril 5 mg one daily for blood pressure and diabetes  Take aspirin 81 mg one daily, over-the-counter  Plan to return in one month for a recheck.  Return sooner any time if problems  Goal of trying to lose 25 pounds between now and the end of the year.  Also  work on cutting back on the cigarettes. We will discuss this more at a future time. Tobacco and diabetes combine to make you high risk of cardiovascular disease.  It is advisable for a diabetic to see an eye doctor once a year. I recommend you see one sometime in the near future. Consider [place] [facility] on [address].     No Follow-up on file.   HOPPER,DAVID, MD 09/11/2015

## 2015-09-11 NOTE — Telephone Encounter (Signed)
Pharm called for clarification on sig for metformin that just states take as directed. I gave them inst's on the OV notes and changed in EPIC

## 2015-09-11 NOTE — Patient Instructions (Addendum)
Read the American Diabetic Association website online  Work hard on decreasing your food intake as discussed.  Get regular exercise as discussed  Take metformin ER 500 mg one half daily for 3 days, then twice daily. After 1-2 weeks increase to 500 mg twice daily if tolerated.  Take Amaryl (glimepiride) 2 mg one daily at breakfast  Monitor your blood sugar before breakfast and 2 hours after your main meal several times a week. Keep a long [place] your blood sugars and bring them in when you come in. You can find website applications wear you can keep a record on line on your phone if you wish.  Take lisinopril 5 mg one daily for blood pressure and diabetes  Take aspirin 81 mg one daily, over-the-counter  Plan to return in one month for a recheck.  Return sooner any time if problems  Goal of trying to lose 25 pounds between now and the end of the year.  Also work on cutting back on the cigarettes. We will discuss this more at a future time. Tobacco and diabetes combine to make you high risk of cardiovascular disease.  It is advisable for a diabetic to see an eye doctor once a year. I recommend you see one sometime in the near future. Consider [place] [facility] on [address].

## 2015-09-16 ENCOUNTER — Encounter: Payer: Self-pay | Admitting: Family Medicine

## 2015-12-25 MED FILL — LISINOPRIL 5 MG TABLET: 5 | 90 days supply | Qty: 90 | Fill #1

## 2015-12-25 MED FILL — GLIMEPIRIDE 2 MG TABLET: 2 | 30 days supply | Qty: 30 | Fill #3

## 2015-12-25 MED FILL — METFORMIN HCL ER 500 MG TAB: 500 | 30 days supply | Qty: 60 | Fill #3

## 2016-02-20 ENCOUNTER — Ambulatory Visit (INDEPENDENT_AMBULATORY_CARE_PROVIDER_SITE_OTHER): Payer: 59 | Admitting: Family Medicine

## 2016-02-20 ENCOUNTER — Ambulatory Visit (INDEPENDENT_AMBULATORY_CARE_PROVIDER_SITE_OTHER): Payer: 59

## 2016-02-20 VITALS — BP 144/100 | HR 79 | Temp 98.0°F | Resp 17 | Ht 69.0 in | Wt 303.0 lb

## 2016-02-20 DIAGNOSIS — R079 Chest pain, unspecified: Secondary | ICD-10-CM | POA: Diagnosis not present

## 2016-02-20 DIAGNOSIS — R05 Cough: Secondary | ICD-10-CM | POA: Diagnosis not present

## 2016-02-20 DIAGNOSIS — E1165 Type 2 diabetes mellitus with hyperglycemia: Secondary | ICD-10-CM

## 2016-02-20 DIAGNOSIS — R0781 Pleurodynia: Secondary | ICD-10-CM

## 2016-02-20 DIAGNOSIS — E119 Type 2 diabetes mellitus without complications: Secondary | ICD-10-CM

## 2016-02-20 DIAGNOSIS — R7309 Other abnormal glucose: Secondary | ICD-10-CM | POA: Diagnosis not present

## 2016-02-20 DIAGNOSIS — I10 Essential (primary) hypertension: Secondary | ICD-10-CM

## 2016-02-20 LAB — COMPREHENSIVE METABOLIC PANEL
ALK PHOS: 43 U/L (ref 40–115)
ALT: 18 U/L (ref 9–46)
AST: 15 U/L (ref 10–40)
Albumin: 4.4 g/dL (ref 3.6–5.1)
BUN: 13 mg/dL (ref 7–25)
CHLORIDE: 102 mmol/L (ref 98–110)
CO2: 24 mmol/L (ref 20–31)
CREATININE: 0.93 mg/dL (ref 0.60–1.35)
Calcium: 9.3 mg/dL (ref 8.6–10.3)
GLUCOSE: 187 mg/dL — AB (ref 65–99)
POTASSIUM: 4.9 mmol/L (ref 3.5–5.3)
SODIUM: 136 mmol/L (ref 135–146)
TOTAL PROTEIN: 7.3 g/dL (ref 6.1–8.1)
Total Bilirubin: 0.4 mg/dL (ref 0.2–1.2)

## 2016-02-20 LAB — CBC
HCT: 41.8 % (ref 39.0–52.0)
HEMOGLOBIN: 14.2 g/dL (ref 13.0–17.0)
MCH: 28.6 pg (ref 26.0–34.0)
MCHC: 34 g/dL (ref 30.0–36.0)
MCV: 84.3 fL (ref 78.0–100.0)
MPV: 10 fL (ref 8.6–12.4)
PLATELETS: 231 10*3/uL (ref 150–400)
RBC: 4.96 MIL/uL (ref 4.22–5.81)
RDW: 14.8 % (ref 11.5–15.5)
WBC: 5.7 10*3/uL (ref 4.0–10.5)

## 2016-02-20 LAB — POCT URINALYSIS DIP (MANUAL ENTRY)
BILIRUBIN UA: NEGATIVE
BILIRUBIN UA: NEGATIVE
Leukocytes, UA: NEGATIVE
Nitrite, UA: NEGATIVE
RBC UA: NEGATIVE
SPEC GRAV UA: 1.025
UROBILINOGEN UA: 0.2
pH, UA: 6.5

## 2016-02-20 LAB — POC MICROSCOPIC URINALYSIS (UMFC): MUCUS RE: ABSENT

## 2016-02-20 LAB — SEDIMENTATION RATE: SED RATE: 4 mm/h (ref 0–15)

## 2016-02-20 LAB — POCT GLYCOSYLATED HEMOGLOBIN (HGB A1C): Hemoglobin A1C: 9.1

## 2016-02-20 MED ORDER — INDOMETHACIN 50 MG PO CAPS: 50.0000 mg | ORAL_CAPSULE | Freq: Three times a day (TID) | ORAL | Status: DC

## 2016-02-20 MED ORDER — GLIMEPIRIDE 2 MG PO TABS: 2.0000 mg | ORAL_TABLET | Freq: Every day | ORAL | Status: DC

## 2016-02-20 MED ORDER — CYCLOBENZAPRINE HCL 10 MG PO TABS: 10.0000 mg | ORAL_TABLET | Freq: Every day | ORAL | Status: DC

## 2016-02-20 MED ORDER — HYDROCHLOROTHIAZIDE 25 MG PO TABS: 25.0000 mg | ORAL_TABLET | Freq: Every day | ORAL | Status: AC

## 2016-02-20 MED ORDER — METFORMIN HCL ER 500 MG PO TB24: 2000.0000 mg | ORAL_TABLET | Freq: Every day | ORAL | Status: AC

## 2016-02-20 MED FILL — CYCLOBENZAPRINE 10 MG TAB: 10 | 30 days supply | Qty: 30 | Fill #0

## 2016-02-20 MED FILL — GLIMEPIRIDE 2 MG TABLET: 2 | 30 days supply | Qty: 30 | Fill #0

## 2016-02-20 MED FILL — METFORMIN HCL ER 500 MG TAB: 500 | 30 days supply | Qty: 120 | Fill #0

## 2016-02-20 MED FILL — INDOMETHACIN 50 MG CAPSULE: 50 | 10 days supply | Qty: 30 | Fill #0

## 2016-02-20 MED FILL — HYDROCHLOROTHIAZIDE 25 MG T: 25 | 90 days supply | Qty: 90 | Fill #0

## 2016-02-20 NOTE — Progress Notes (Signed)
Subjective:  By signing my name below, I, Moises Blood, attest that this documentation has been prepared under the direction and in the presence of Delman Cheadle, MD. Electronically Signed: Moises Blood, [place]. 02/20/2016 , 10:30 AM .  Patient was seen in Room 13 .   Patient ID: Frederick Grant, male    DOB: 1976/03/04, 40 y.o.   MRN: [id number] Chief Complaint  Patient presents with  . Abdominal Pain    luq    HPI Frederick Grant is a 40 y.o. male who presents to [facility] complaining of left flank abdominal pain that worsened last night. He reports having cough for about a week and has left flank pain when he coughs. However, last night, the patient noticed that he's gotten worse and the pain is more constant now. He denies taking any medication for the cough. He denies any history of abdominal surgery. He denies ever seeing GI doctor. He denies any change in bowels, appetite loss, shortness of breath chest pain or change in activity. He's taken ibuprofen 253m 4 times without relief in the pain.   His sugar has been running 180-220s.   Past Medical History  Diagnosis Date  . [place] mellitus without complication ([place]   . Hypertension    Prior to Admission medications   Medication Sig Start Date End Date Taking? Authorizing Provider  blood glucose meter kit and supplies Dispense based on patient and insurance preference. Use up to four times daily as directed. (FOR ICD-9 250.00, 250.01). 09/11/15  Yes DPosey Boyer MD  glimepiride (AMARYL) 2 MG tablet Take 1 tablet (2 mg total) by mouth daily before breakfast. 09/11/15  Yes DPosey Boyer MD  lisinopril (PRINIVIL,ZESTRIL) 5 MG tablet Take 1 tablet (5 mg total) by mouth daily. 09/11/15  Yes DPosey Boyer MD  metFORMIN (GLUCOPHAGE-XR) 500 MG 24 hr tablet Take metformin ER 500 mg one half daily for 3 days, then twice daily. After 1-2 weeks increase to 500 mg twice daily if tolerated. 09/11/15  Yes DPosey Boyer MD   No Known  Allergies  Review of Systems  Constitutional: Negative for fever, chills, activity change, appetite change and fatigue.  Respiratory: Positive for cough. Negative for chest tightness, shortness of breath and wheezing.   Cardiovascular: Negative for chest pain.  Gastrointestinal: Positive for abdominal pain. Negative for nausea, vomiting, diarrhea and constipation.  Endocrine: Negative for polydipsia, polyphagia and polyuria.  Genitourinary: Positive for flank pain.  Musculoskeletal: Positive for myalgias and back pain.  Skin: Negative for pallor and rash.       Objective:   Physical Exam  Constitutional: He is oriented to person, place, and time. He appears well-developed and well-nourished. No distress.  HENT:  Head: Normocephalic and atraumatic.  Eyes: EOM are normal. Pupils are equal, round, and reactive to light.  Neck: Neck supple.  Cardiovascular: Normal rate.   Pulmonary/Chest: Effort normal and breath sounds normal. No respiratory distress. He has no wheezes.  Abdominal:  Pain over left lowest rib, no abdominal pain  Musculoskeletal: Normal range of motion.  Neurological: He is alert and oriented to person, place, and time.  Skin: Skin is warm and dry.  Psychiatric: He has a normal mood and affect. His behavior is normal.  Nursing note and vitals reviewed.  BP 144/100 mmHg  Pulse 79  Temp(Src) 98 F (36.7 C) (Oral)  Resp 17  Ht '5\' 9"'  (1.753 m)  Wt 303 lb (137.44 kg)  BMI 44.72 kg/m2  SpO2 98%  Results for orders placed or performed in visit on 02/20/16  POCT urinalysis dipstick  Result Value Ref Range   Color, UA yellow yellow   Clarity, UA clear clear   Glucose, UA =100 (A) negative   Bilirubin, UA negative negative   Ketones, POC UA negative negative   Spec Grav, UA 1.025    Blood, UA negative negative   pH, UA 6.5    Protein Ur, POC trace (A) negative   Urobilinogen, UA 0.2    Nitrite, UA Negative Negative   Leukocytes, UA Negative Negative  POCT  Microscopic Urinalysis ([facility])  Result Value Ref Range   WBC,UR,HPF,POC None None WBC/hpf   RBC,UR,HPF,POC None None RBC/hpf   Bacteria None None, Too numerous to count   Mucus Absent Absent   Epithelial Cells, UR Per Microscopy Few (A) None, Too numerous to count cells/hpf   Dg Ribs Unilateral W/chest Left  02/20/2016  CLINICAL DATA:  Left side rib pain, left flank pain beginning last night. Cough for 1 week. EXAM: LEFT RIBS AND CHEST - 3+ VIEW COMPARISON:  None. FINDINGS: No fracture or other bone lesions are seen involving the ribs. There is no evidence of pneumothorax or pleural effusion. Both lungs are clear. Heart size and mediastinal contours are within normal limits. IMPRESSION: Negative. Electronically Signed   By: Rolm Baptise M.D.   On: 02/20/2016 11:02       Assessment & Plan:   1. Rib pain on left side - rib injury from cough, start nsaids - try indomethacin since no improvement with otc and qhs flexeril.  2. Type 2 diabetes mellitus with hyperglycemia, without long-term current use of insulin (HCC) - has not been great about med compliance, increase metformin from 1000 -> 2092m qd, cont amaryl  3. Essential hypertension - cont lisinopril 5 start hctz 25       Orders Placed This Encounter  Procedures  . DG Ribs Unilateral W/Chest Left    Standing Status: Future     Number of Occurrences: 1     Standing Expiration Date: 02/19/2017    Order Specific Question:  Reason for Exam (SYMPTOM  OR DIAGNOSIS REQUIRED)    Answer:  left lower anterior rib pain    Order Specific Question:  Preferred imaging location?    Answer:  External  . CBC  . Sedimentation Rate  . Comprehensive metabolic panel  . Microalbumin/Creatinine Ratio, Urine  . POCT urinalysis dipstick  . POCT Microscopic Urinalysis ([facility])  . POCT glycosylated hemoglobin (Hb A1C)    Meds ordered this encounter  Medications  . indomethacin (INDOCIN) 50 MG capsule    Sig: Take 1 capsule (50 mg total) by mouth 3  (three) times daily with meals.    Dispense:  30 capsule    Refill:  0  . hydrochlorothiazide (HYDRODIURIL) 25 MG tablet    Sig: Take 1 tablet (25 mg total) by mouth daily.    Dispense:  90 tablet    Refill:  3  . cyclobenzaprine (FLEXERIL) 10 MG tablet    Sig: Take 1 tablet (10 mg total) by mouth at bedtime.    Dispense:  30 tablet    Refill:  0  . metFORMIN (GLUCOPHAGE-XR) 500 MG 24 hr tablet    Sig: Take 4 tablets (2,000 mg total) by mouth daily with breakfast.    Dispense:  120 tablet    Refill:  3  . glimepiride (AMARYL) 2 MG tablet    Sig: Take 1 tablet (2 mg total)  by mouth daily before breakfast.    Dispense:  30 tablet    Refill:  3    I personally performed the services described in this documentation, which was scribed in my presence. The recorded information has been reviewed and considered, and addended by me as needed.  Delman Cheadle, MD MPH

## 2016-02-20 NOTE — Patient Instructions (Addendum)
Because you received an x-ray today, you will receive an invoice from [facility]. Please contact [facility] at [phone] with questions or concerns regarding your invoice. Our billing staff will not be able to assist you with those questions. Stop the lisinopril for 1 week only since sometimes it can cause a cough and we need to suppress your cough. Start delsym or mucinex DM twice a day to suppress your cough.  Use sugar free hard candy. Start hctz for your blood pressure. When you restart your lisinopril in 1 week, likely continue your hctz - all of your blood pressure have been to high and the indomethacin may make that worse.  You need to increase your diabetes medicine metformin to 4 tabs in the morning.  Continue on the amaryl. Your diabetes is doing quite poorly.  Rib Contusion A rib contusion is a deep bruise on your rib area. Contusions are the result of a blunt trauma that causes bleeding and injury to the tissues under the skin. A rib contusion may involve bruising of the ribs and of the skin and muscles in the area. The skin overlying the contusion may turn blue, purple, or yellow. Minor injuries will give you a painless contusion, but more severe contusions may stay painful and swollen for a few weeks. CAUSES  A contusion is usually caused by a blow, trauma, or direct force to an area of the body. This often occurs while playing contact sports. SYMPTOMS  Swelling and redness of the injured area.  Discoloration of the injured area.  Tenderness and soreness of the injured area.  Pain with or without movement. DIAGNOSIS  The diagnosis can be made by taking a medical history and performing a physical exam. An X-ray, CT scan, or MRI may be needed to determine if there were any associated injuries, such as broken bones (fractures) or internal injuries. TREATMENT  Often, the best treatment for a rib contusion is rest. Icing or applying cold compresses to the  injured area may help reduce swelling and inflammation. Deep breathing exercises may be recommended to reduce the risk of partial lung collapse and pneumonia. Over-the-counter or prescription medicines may also be recommended for pain control. HOME CARE INSTRUCTIONS   Apply ice to the injured area:  Put ice in a plastic bag.  Place a towel between your skin and the bag.  Leave the ice on for 20 minutes, 2-3 times per day.  Take medicines only as directed by your health care provider.  Rest the injured area. Avoid strenuous activity and any activities or movements that cause pain. Be careful during activities and avoid bumping the injured area.  Perform deep-breathing exercises as directed by your health care provider.  Do not lift anything that is heavier than 5 lb (2.3 kg) until your health care provider approves.  Do not use any tobacco products, including cigarettes, chewing tobacco, or electronic cigarettes. If you need help quitting, ask your health care provider. SEEK MEDICAL CARE IF:   You have increased bruising or swelling.  You have pain that is not controlled with treatment.  You have a fever. SEEK IMMEDIATE MEDICAL CARE IF:   You have difficulty breathing or shortness of breath.  You develop a continual cough, or you cough up thick or bloody sputum.  You feel sick to your stomach (nauseous), you throw up (vomit), or you have abdominal pain.   This information is not intended to replace advice given to you by your health care provider. Make sure  you discuss any questions you have with your health care provider.   Document Released: 09/02/2001 Document Revised: 12/29/2014 Document Reviewed: 09/19/2014 Elsevier Interactive Patient Education [employer]

## 2016-02-21 LAB — MICROALBUMIN / CREATININE URINE RATIO
Creatinine, Urine: 174 mg/dL (ref 20–370)
MICROALB UR: 1.4 mg/dL
MICROALB/CREAT RATIO: 8 ug/mg{creat} (ref <30)

## 2016-04-07 ENCOUNTER — Ambulatory Visit (INDEPENDENT_AMBULATORY_CARE_PROVIDER_SITE_OTHER): Payer: 59 | Admitting: Urgent Care

## 2016-04-07 ENCOUNTER — Ambulatory Visit (INDEPENDENT_AMBULATORY_CARE_PROVIDER_SITE_OTHER): Payer: 59

## 2016-04-07 ENCOUNTER — Encounter (HOSPITAL_COMMUNITY): Payer: Self-pay | Admitting: Emergency Medicine

## 2016-04-07 ENCOUNTER — Ambulatory Visit (HOSPITAL_COMMUNITY)
Admission: RE | Admit: 2016-04-07 | Discharge: 2016-04-07 | Disposition: A | Discharge status: Home / Self Care | Payer: 59 | Source: Ambulatory Visit | Attending: Urgent Care | Admitting: Urgent Care

## 2016-04-07 ENCOUNTER — Emergency Department (HOSPITAL_COMMUNITY)
Admission: EM | Admit: 2016-04-07 | Discharge: 2016-04-08 | Disposition: A | Discharge status: Home / Self Care | Payer: 59 | Attending: Emergency Medicine | Admitting: Emergency Medicine

## 2016-04-07 VITALS — BP 144/88 | HR 94 | Temp 98.0°F | Resp 18 | Ht 69.0 in | Wt 295.8 lb

## 2016-04-07 DIAGNOSIS — R1011 Right upper quadrant pain: Secondary | ICD-10-CM | POA: Diagnosis not present

## 2016-04-07 DIAGNOSIS — R630 Anorexia: Secondary | ICD-10-CM

## 2016-04-07 DIAGNOSIS — K858 Other acute pancreatitis without necrosis or infection: Secondary | ICD-10-CM | POA: Diagnosis not present

## 2016-04-07 DIAGNOSIS — I10 Essential (primary) hypertension: Secondary | ICD-10-CM | POA: Insufficient documentation

## 2016-04-07 DIAGNOSIS — E119 Type 2 diabetes mellitus without complications: Secondary | ICD-10-CM | POA: Diagnosis not present

## 2016-04-07 DIAGNOSIS — R109 Unspecified abdominal pain: Secondary | ICD-10-CM

## 2016-04-07 DIAGNOSIS — Z79899 Other long term (current) drug therapy: Secondary | ICD-10-CM | POA: Insufficient documentation

## 2016-04-07 DIAGNOSIS — I700 Atherosclerosis of aorta: Secondary | ICD-10-CM | POA: Insufficient documentation

## 2016-04-07 DIAGNOSIS — F17210 Nicotine dependence, cigarettes, uncomplicated: Secondary | ICD-10-CM | POA: Diagnosis not present

## 2016-04-07 DIAGNOSIS — Z7984 Long term (current) use of oral hypoglycemic drugs: Secondary | ICD-10-CM | POA: Insufficient documentation

## 2016-04-07 DIAGNOSIS — R935 Abnormal findings on diagnostic imaging of other abdominal regions, including retroperitoneum: Secondary | ICD-10-CM

## 2016-04-07 DIAGNOSIS — R1013 Epigastric pain: Secondary | ICD-10-CM | POA: Diagnosis present

## 2016-04-07 LAB — POCT CBC
Granulocyte percent: 55.8 %G (ref 37–80)
HCT, POC: 42 % — AB (ref 43.5–53.7)
Hemoglobin: 14.8 g/dL (ref 14.1–18.1)
Lymph, poc: 3.1 (ref 0.6–3.4)
MCH, POC: 29.6 pg (ref 27–31.2)
MCHC: 35.3 g/dL (ref 31.8–35.4)
MCV: 83.9 fL (ref 80–97)
MID (cbc): 0.5 (ref 0–0.9)
MPV: 7.9 fL (ref 0–99.8)
POC Granulocyte: 4.6 (ref 2–6.9)
POC LYMPH PERCENT: 38.4 %L (ref 10–50)
POC MID %: 5.8 %M (ref 0–12)
Platelet Count, POC: 213 10*3/uL (ref 142–424)
RBC: 5.01 M/uL (ref 4.69–6.13)
RDW, POC: 14.8 %
WBC: 8.2 10*3/uL (ref 4.6–10.2)

## 2016-04-07 LAB — POCT URINALYSIS DIP (MANUAL ENTRY)
Bilirubin, UA: NEGATIVE
Blood, UA: NEGATIVE
Glucose, UA: 100 — AB
Ketones, POC UA: NEGATIVE
Leukocytes, UA: NEGATIVE
Nitrite, UA: NEGATIVE
Protein Ur, POC: NEGATIVE
Spec Grav, UA: 1.015
Urobilinogen, UA: 0.2
pH, UA: 8.5

## 2016-04-07 LAB — GLUCOSE, POCT (MANUAL RESULT ENTRY): POC GLUCOSE: 242 mg/dL — AB (ref 70–99)

## 2016-04-07 MED ORDER — SODIUM CHLORIDE 0.9 % IV BOLUS (SEPSIS)
1000.0000 mL | Freq: Once | INTRAVENOUS | Status: AC
Start: 2016-04-07 — End: 2016-04-08
  Administered 2016-04-07: 1000 mL via INTRAVENOUS

## 2016-04-07 MED ORDER — MORPHINE SULFATE (PF) 4 MG/ML IV SOLN
4.0000 mg | Freq: Once | INTRAVENOUS | Status: AC
  Administered 2016-04-07: 4 mg via INTRAVENOUS
  Filled 2016-04-07: qty 1

## 2016-04-07 MED ORDER — IOPAMIDOL (ISOVUE-300) INJECTION 61%
100.0000 mL | Freq: Once | INTRAVENOUS | Status: AC | PRN
  Administered 2016-04-07: 100 mL via INTRAVENOUS

## 2016-04-07 MED ORDER — ONDANSETRON HCL 4 MG/2ML IJ SOLN
4.0000 mg | Freq: Once | INTRAMUSCULAR | Status: AC
Start: 2016-04-07 — End: 2016-04-07
  Administered 2016-04-07: 4 mg via INTRAVENOUS
  Filled 2016-04-07: qty 2

## 2016-04-07 NOTE — Patient Instructions (Addendum)
    Abdominal Pain, Adult Many things can cause abdominal pain. Usually, abdominal pain is not caused by a disease and will improve without treatment. It can often be observed and treated at home. Your health care provider will do a physical exam and possibly order blood tests and X-rays to help determine the seriousness of your pain. However, in many cases, more time must pass before a clear cause of the pain can be found. Before that point, your health care provider may not know if you need more testing or further treatment. HOME CARE INSTRUCTIONS Monitor your abdominal pain for any changes. The following actions may help to alleviate any discomfort you are experiencing:  Only take over-the-counter or prescription medicines as directed by your health care provider.  Do not take laxatives unless directed to do so by your health care provider.  Try a clear liquid diet (broth, tea, or water) as directed by your health care provider. Slowly move to a bland diet as tolerated. SEEK MEDICAL CARE IF:  You have unexplained abdominal pain.  You have abdominal pain associated with nausea or diarrhea.  You have pain when you urinate or have a bowel movement.  You experience abdominal pain that wakes you in the night.  You have abdominal pain that is worsened or improved by eating food.  You have abdominal pain that is worsened with eating fatty foods.  You have a fever. SEEK IMMEDIATE MEDICAL CARE IF:  Your pain does not go away within 2 hours.  You keep throwing up (vomiting).  Your pain is felt only in portions of the abdomen, such as the right side or the left lower portion of the abdomen.  You pass bloody or black tarry stools. MAKE SURE YOU:  Understand these instructions.  Will watch your condition.  Will get help right away if you are not doing well or get worse.   This information is not intended to replace advice given to you by your health care provider. Make sure you  discuss any questions you have with your health care provider.   Document Released: 09/17/2005 Document Revised: 08/29/2015 Document Reviewed: 08/17/2013 Elsevier Interactive Patient Education 2016 [employer]    IF you received an x-ray today, you will receive an invoice from [facility]. Please contact [facility] at [phone] with questions or concerns regarding your invoice.   IF you received labwork today, you will receive an invoice from [facility]. Please contact [facility] at [phone] with questions or concerns regarding your invoice.   Our billing staff will not be able to assist you with questions regarding bills from these companies.  You will be contacted with the lab results as soon as they are available. The fastest way to get your results is to activate your My Chart account. Instructions are located on the last page of this paperwork. If you have not heard from us regarding the results in 2 weeks, please contact this office.     [facility] and register as an out patient for the CT abdomen with contrast.    They will run an I stat lab on you to check your kidney function prior to the CT scan.    Pt drank 1 bottle of contrast at 7;30 pm.

## 2016-04-07 NOTE — Progress Notes (Signed)
MRN: [id number] DOB: Mar 24, 1976  Subjective:   Frederick Grant is a 40 y.o. male presenting for chief complaint of Abdominal Pain  Reports 4 day history of RUQ belly pain that radiates to his epigastric region, yesterday noticed that he had bloody dark stools. Pain is sharp, comes in waves. Had vomiting episode yesterday from pressing on his belly to try and burp, pass gas. His last BM was this morning, was a small amount, has passed gas a few times today. He has tried alka-seltzer, Tums, Pepto Bismol. Denies fever, chest pain, shob, diarrhea, hematuria, dysuria. Diet consists of a mix of foods, generally does get fiber in his diet.  Prestyn has a current medication list which includes the following prescription(s): blood glucose meter kit and supplies, glimepiride, lisinopril, metformin, and hydrochlorothiazide. Also has No Known Allergies.  Jamol  has a past medical history of Diabetes mellitus without complication ([place]) and Hypertension. Also  has no past surgical history on file.  Objective:   Vitals: BP 144/88 mmHg  Pulse 94  Temp(Src) 98 F (36.7 C) (Oral)  Resp 18  Ht '5\' 9"'  (1.753 m)  Wt 295 lb 12.8 oz (134.174 kg)  BMI 43.66 kg/m2  SpO2 97%  Physical Exam  Constitutional: He is oriented to person, place, and time. He appears well-developed and well-nourished.  HENT:  Mouth/Throat: Oropharynx is clear and moist.  Eyes: No scleral icterus.  Cardiovascular: Normal rate, regular rhythm and intact distal pulses.  Exam reveals no gallop and no friction rub.   No murmur heard. Pulmonary/Chest: No respiratory distress. He has no wheezes. He has no rales.  Abdominal: Soft. Bowel sounds are normal. He exhibits no distension and no mass. There is tenderness (right sided, RUQ worst).  Neurological: He is alert and oriented to person, place, and time.  Skin: Skin is warm and dry.   Results for orders placed or performed in visit on 04/07/16 (from the past 24 hour(s))  POCT CBC      Status: Abnormal   Collection Time: 04/07/16  6:15 PM  Result Value Ref Range   WBC 8.2 4.6 - 10.2 K/uL   Lymph, poc 3.1 0.6 - 3.4   POC LYMPH PERCENT 38.4 10 - 50 %L   MID (cbc) 0.5 0 - 0.9   POC MID % 5.8 0 - 12 %M   POC Granulocyte 4.6 2 - 6.9   Granulocyte percent 55.8 37 - 80 %G   RBC 5.01 4.69 - 6.13 M/uL   Hemoglobin 14.8 14.1 - 18.1 g/dL   HCT, POC 42.0 (A) 43.5 - 53.7 %   MCV 83.9 80 - 97 fL   MCH, POC 29.6 27 - 31.2 pg   MCHC 35.3 31.8 - 35.4 g/dL   RDW, POC 14.8 %   Platelet Count, POC 213 142 - 424 K/uL   MPV 7.9 0 - 99.8 fL  POCT urinalysis dipstick     Status: Abnormal   Collection Time: 04/07/16  7:07 PM  Result Value Ref Range   Color, UA yellow yellow   Clarity, UA hazy (A) clear   Glucose, UA =100 (A) negative   Bilirubin, UA negative negative   Ketones, POC UA negative negative   Spec Grav, UA 1.015    Blood, UA negative negative   pH, UA 8.5    Protein Ur, POC negative negative   Urobilinogen, UA 0.2    Nitrite, UA Negative Negative   Leukocytes, UA Negative Negative   Dg Abd 1 View  04/07/2016  CLINICAL DATA:  Right upper quadrant pain EXAM: ABDOMEN - 1 VIEW COMPARISON:  None. FINDINGS: Scattered large and small bowel gas is noted. No abnormal mass or abnormal calcifications are seen. No free air is noted. No acute bony abnormality is seen. IMPRESSION: No acute abnormality noted. Electronically Signed   By: Inez Catalina M.D.   On: 04/07/2016 18:33    Assessment and Plan :   This case was precepted with Dr. Tamala Julian.   1. RUQ pain 2. Right sided abdominal pain 3. Decreased appetite - Will send for stat CT to rule out acute process. Patient agreed.  Jaynee Eagles, PA-C [facility] [phone] 04/07/2016 5:57 PM

## 2016-04-07 NOTE — ED Notes (Signed)
Pt sent here for outpatient CT scan and now has confirmed pancreatitis after 4-5 days of abdominal pain. Alert and oriented.

## 2016-04-07 NOTE — ED Provider Notes (Signed)
CSN: [id number]     Arrival date & time 04/07/16  2235 History   First MD Initiated Contact with Patient 04/07/16 2302     Chief Complaint  Patient presents with  . Abdominal Pain     (Consider location/radiation/quality/duration/timing/severity/associated sxs/prior Treatment) Patient is a 40 y.o. male presenting with abdominal pain. The history is provided by the patient and medical records.  Abdominal Pain Associated symptoms: nausea and vomiting     40 year old male with history of diabetes and hypertension, presenting to the ED for abdominal pain. Patient reports for the past 4-5 days he has had epigastric and right upper quadrant abdominal pain. He was seen today urgently care and sent for outpatient CT scan, called later this evening and notified to come to the hospital with concern for pancreatitis.  Patient states he continues to have intermittent, sharp, stabbing sensation to his epigastrium with some radiation to his back. He does report one episode of emesis yesterday that was self-induced as he felt it would make him feel better. Otherwise he has not had any vomiting. He does report some nausea and decreased appetite. Patient denies any history of abdominal surgeries. Denies history of gallstones. Does admit to occasional alcohol use, he did drink a few beers over the weekend as he was at the beach with his family on vacation.  VSS.  Past Medical History  Diagnosis Date  . Diabetes mellitus without complication ([place])   . Hypertension    History reviewed. No pertinent past surgical history. Family History  Problem Relation Age of Onset  . Diabetes Mother    Social History  Substance Use Topics  . Smoking status: Current Every Day Smoker -- 1.50 packs/day for 22 years    Types: Cigarettes  . Smokeless tobacco: Never Used  . Alcohol Use: 0.0 oz/week    0 Standard drinks or equivalent per week    Review of Systems  Gastrointestinal: Positive for nausea, vomiting and  abdominal pain.  All other systems reviewed and are negative.     Allergies  Review of patient's allergies indicates no known allergies.  Home Medications   Prior to Admission medications   Medication Sig Start Date End Date Taking? Authorizing Provider  blood glucose meter kit and supplies Dispense based on patient and insurance preference. Use up to four times daily as directed. (FOR ICD-9 250.00, 250.01). 09/11/15  Yes Posey Boyer, MD  glimepiride (AMARYL) 2 MG tablet Take 1 tablet (2 mg total) by mouth daily before breakfast. 02/20/16  Yes Shawnee Knapp, MD  lisinopril (PRINIVIL,ZESTRIL) 5 MG tablet Take 1 tablet (5 mg total) by mouth daily. 09/11/15  Yes Posey Boyer, MD  metFORMIN (GLUCOPHAGE-XR) 500 MG 24 hr tablet Take 4 tablets (2,000 mg total) by mouth daily with breakfast. 02/20/16  Yes Shawnee Knapp, MD  hydrochlorothiazide (HYDRODIURIL) 25 MG tablet Take 1 tablet (25 mg total) by mouth daily. Patient not taking: Reported on 04/07/2016 02/20/16   Shawnee Knapp, MD   BP 162/102 mmHg  Pulse 103  Temp(Src) 98.2 F (36.8 C) (Oral)  Resp 18  SpO2 99%   Physical Exam  Constitutional: He is oriented to person, place, and time. He appears well-developed and well-nourished. No distress.  HENT:  Head: Normocephalic and atraumatic.  Mouth/Throat: Oropharynx is clear and moist.  Eyes: Conjunctivae and EOM are normal. Pupils are equal, round, and reactive to light.  Neck: Normal range of motion.  Cardiovascular: Normal rate, regular rhythm and normal heart sounds.  Pulmonary/Chest: Effort normal and breath sounds normal. No respiratory distress. He has no wheezes.  Abdominal: Soft. Bowel sounds are normal. There is tenderness in the epigastric area. There is no CVA tenderness.  Obese abdomen, soft, tenderness in epigastrium without rebound or guarding  Musculoskeletal: Normal range of motion.  Neurological: He is alert and oriented to person, place, and time.  Skin: Skin is warm and dry.  He is not diaphoretic.  Psychiatric: He has a normal mood and affect.  Nursing note and vitals reviewed.   ED Course  Procedures (including critical care time) Labs Review Labs Reviewed  COMPREHENSIVE METABOLIC PANEL - Abnormal; Notable for the following:    Sodium 133 (*)    Chloride 95 (*)    Glucose, Bld 227 (*)    Total Protein 8.3 (*)    All other components within normal limits  LIPASE, BLOOD - Abnormal; Notable for the following:    Lipase 187 (*)    All other components within normal limits  CBC WITH DIFFERENTIAL/PLATELET    Imaging Review Dg Abd 1 View  04/07/2016  CLINICAL DATA:  Right upper quadrant pain EXAM: ABDOMEN - 1 VIEW COMPARISON:  None. FINDINGS: Scattered large and small bowel gas is noted. No abnormal mass or abnormal calcifications are seen. No free air is noted. No acute bony abnormality is seen. IMPRESSION: No acute abnormality noted. Electronically Signed   By: Inez Catalina M.D.   On: 04/07/2016 18:33   Ct Abdomen Pelvis W Contrast  04/07/2016  CLINICAL DATA:  Right upper quadrant abdominal pain radiating to the epigastric region, vomiting, dark stools EXAM: CT ABDOMEN AND PELVIS WITH CONTRAST TECHNIQUE: Multidetector CT imaging of the abdomen and pelvis was performed using the standard protocol following bolus administration of intravenous contrast. CONTRAST:  19m ISOVUE-300 IOPAMIDOL (ISOVUE-300) INJECTION 61% COMPARISON:  None. FINDINGS: Lower chest:  Lung bases are clear. Hepatobiliary: Liver is within normal limits. No suspicious/enhancing hepatic lesions. Gallbladder is unremarkable. No intrahepatic or extrahepatic ductal dilatation. Pancreas: Mild inflammatory changes/fluid along the pancreaticoduodenal groove (series 2/images 34 and 37). Mild pancreatitis involving the pancreatic head/uncinate process is possible. No associated dilatation of the main pancreatic duct. No peripancreatic fluid collections/pseudocysts. Spleen: Within normal limits.  Adrenals/Urinary Tract: Adrenal glands are within normal limits. 8 mm hyperdensity in the right lower kidney (series 2/ image 41) is favored to reflect early excretory contrast within the renal collecting system. A nonobstructing calculus is considered less likely but remains possible. Left kidney is within normal limits. No ureteral or bladder calculi. Bladder is within normal limits. Stomach/Bowel: Stomach is within normal limits. No evidence of bowel obstruction. Fluid/inflammatory changes along the pancreaticoduodenal groove (as described above), possibly reflecting duodenitis or inflammatory changes related to a duodenal ulcer, centered at the junction of the 2nd and 3rd portions of the duodenum. No free air. Normal appendix (series 2/ image 46). Vascular/Lymphatic: No evidence of abdominal aortic aneurysm. Mild atherosclerotic calcifications the abdominal aorta. No suspicious abdominopelvic lymphadenopathy. Small bilateral inguinal nodes measuring up to 1.5 cm short axis on the left, likely reactive. Reproductive: Prostate is grossly unremarkable. Other: No abdominopelvic ascites. Musculoskeletal: Mild degenerative changes of the visualized thoracolumbar spine. IMPRESSION: Mild fluid/inflammatory changes along the pancreaticoduodenal groove. Correlate with laboratory evaluation to exclude mild acute pancreatitis. No associated peripancreatic fluid collection/pseudocyst. Alternatively, this appearance could reflect duodenitis and/or sequela of a duodenal ulcer, centered at the junction of the 2nd and 3rd portions of the duodenum. No free air. Consider upper endoscopy as clinically warranted. No  evidence of bowel obstruction.  Normal appendix. These results will be called to the ordering clinician or representative by the Radiology Department at the imaging location. Electronically Signed   By: Julian Hy M.D.   On: 04/07/2016 22:03   I have personally reviewed and evaluated these images and lab results  as part of my medical decision-making.   EKG Interpretation None      MDM   Final diagnoses:  Other acute pancreatitis   40 y.o. M here with abdominal pain x4-5 days.  Had OP CT scan earlier today with concern for pancreatitis so sent here for further eval.  Patient afebrile, non-toxic.  He overall appears well, in no acute distress.  TTP in epigastrium without rebound or guarding.  Requesting to eat during exam.  Given fluids and applesauce.  Labs overall reassuring aside from elevated lipase at 187 confirming mild pancreatitis.  Patient was treated here with IV fluids, morphine, and Zofran with significant improvement of his symptoms. He is currently eating sandwich without difficulty. Given his well appearance and ability to tolerate PO, I feel his pancreatitis can be managed as an outpatient.  Suspect this may be due to his recent EtOH use.  Will discharge home with percocet, zofran.  Encouraged liquid/gentle diet for the next 24-48 hours, progress back to normal as tolerated, alcohol cessation. Given GI follow-up.  Discussed plan with patient, he/she acknowledged understanding and agreed with plan of care.  Return precautions given for new or worsening symptoms.  Larene Pickett, PA-C 66/66/48 [id number]  Delora Fuel, MD 22/40/01 [id number]

## 2016-04-08 LAB — CBC WITH DIFFERENTIAL/PLATELET
BASOS ABS: 0 10*3/uL (ref 0.0–0.1)
BASOS PCT: 0 %
EOS PCT: 1 %
Eosinophils Absolute: 0.1 10*3/uL (ref 0.0–0.7)
HEMATOCRIT: 42.2 % (ref 39.0–52.0)
Hemoglobin: 14.5 g/dL (ref 13.0–17.0)
Lymphocytes Relative: 35 %
Lymphs Abs: 2.9 10*3/uL (ref 0.7–4.0)
MCH: 28.7 pg (ref 26.0–34.0)
MCHC: 34.4 g/dL (ref 30.0–36.0)
MCV: 83.6 fL (ref 78.0–100.0)
MONO ABS: 0.7 10*3/uL (ref 0.1–1.0)
Monocytes Relative: 9 %
NEUTROS ABS: 4.6 10*3/uL (ref 1.7–7.7)
Neutrophils Relative %: 55 %
PLATELETS: 246 10*3/uL (ref 150–400)
RBC: 5.05 MIL/uL (ref 4.22–5.81)
RDW: 14.2 % (ref 11.5–15.5)
WBC: 8.4 10*3/uL (ref 4.0–10.5)

## 2016-04-08 LAB — COMPREHENSIVE METABOLIC PANEL
ALBUMIN: 4.8 g/dL (ref 3.5–5.0)
ALT: 17 U/L (ref 9–46)
ALT: 20 U/L (ref 17–63)
ANION GAP: 9 (ref 5–15)
AST: 14 U/L (ref 10–40)
AST: 20 U/L (ref 15–41)
Albumin: 4.6 g/dL (ref 3.6–5.1)
Alkaline Phosphatase: 46 U/L (ref 40–115)
Alkaline Phosphatase: 39 U/L (ref 38–126)
BILIRUBIN TOTAL: 0.4 mg/dL (ref 0.2–1.2)
BILIRUBIN TOTAL: 0.6 mg/dL (ref 0.3–1.2)
BUN: 14 mg/dL (ref 7–25)
BUN: 14 mg/dL (ref 6–20)
CHLORIDE: 95 mmol/L — AB (ref 101–111)
CO2: 32 mmol/L — AB (ref 20–31)
CO2: 29 mmol/L (ref 22–32)
CREATININE: 1.11 mg/dL (ref 0.60–1.35)
Calcium: 10.4 mg/dL — ABNORMAL HIGH (ref 8.6–10.3)
Calcium: 9.8 mg/dL (ref 8.9–10.3)
Chloride: 93 mmol/L — ABNORMAL LOW (ref 98–110)
Creatinine, Ser: 1.13 mg/dL (ref 0.61–1.24)
GFR calc Af Amer: >60 mL/min (ref >60)
GLUCOSE: 233 mg/dL — AB (ref 65–99)
GLUCOSE: 227 mg/dL — AB (ref 65–99)
Potassium: 4.3 mmol/L (ref 3.5–5.3)
Potassium: 3.8 mmol/L (ref 3.5–5.1)
SODIUM: 135 mmol/L (ref 135–146)
Sodium: 133 mmol/L — ABNORMAL LOW (ref 135–145)
TOTAL PROTEIN: 8.3 g/dL — AB (ref 6.5–8.1)
Total Protein: 7.4 g/dL (ref 6.1–8.1)

## 2016-04-08 LAB — LIPASE: LIPASE: 54 U/L (ref 7–60)

## 2016-04-08 LAB — LIPASE, BLOOD: LIPASE: 187 U/L — AB (ref 11–51)

## 2016-04-08 MED ORDER — ONDANSETRON 4 MG PO TBDP: 4.0000 mg | ORAL_TABLET | Freq: Three times a day (TID) | ORAL | Status: DC | PRN

## 2016-04-08 MED ORDER — OXYCODONE-ACETAMINOPHEN 5-325 MG PO TABS: 1.0000 | ORAL_TABLET | ORAL | Status: AC | PRN

## 2016-04-08 MED FILL — ONDANSETRON ODT 4 MG TABLET: 4 | 3 days supply | Qty: 10 | Fill #0

## 2016-04-08 MED FILL — OXYCODONE/APAP 5-325: 5-325 | 3 days supply | Qty: 20 | Fill #0

## 2016-04-08 NOTE — Discharge Instructions (Signed)
Take the percocet to help with pain.  Take zofran for nausea. As we discussed, recommend gentle diet for the next few days until symptoms improve.  Progress back to normal diet as tolerated.  Refrain from alcohol during this time. Follow-up with GI-- call to make appt. Return to the ED for new or worsening symptoms.  Acute Pancreatitis Acute pancreatitis is a disease in which the pancreas becomes suddenly inflamed. The pancreas is a large gland located behind your stomach. The pancreas produces enzymes that help digest food. The pancreas also releases the hormones glucagon and insulin that help regulate blood sugar. Damage to the pancreas occurs when the digestive enzymes from the pancreas are activated and begin attacking the pancreas before being released into the intestine. Most acute attacks last a couple of days and can cause serious complications. Some people become dehydrated and develop low blood pressure. In severe cases, bleeding into the pancreas can lead to shock and can be life-threatening. The lungs, heart, and kidneys may fail. CAUSES  Pancreatitis can happen to anyone. In some cases, the cause is unknown. Most cases are caused by:  Alcohol abuse.  Gallstones. Other less common causes are:  Certain medicines.  Exposure to certain chemicals.  Infection.  Damage caused by an accident (trauma).  Abdominal surgery. SYMPTOMS   Pain in the upper abdomen that may radiate to the back.  Tenderness and swelling of the abdomen.  Nausea and vomiting. DIAGNOSIS  Your caregiver will perform a physical exam. Blood and stool tests may be done to confirm the diagnosis. Imaging tests may also be done, such as X-rays, CT scans, or an ultrasound of the abdomen. TREATMENT  Treatment usually requires a stay in the hospital. Treatment may include:  Pain medicine.  Fluid replacement through an intravenous line (IV).  Placing a tube in the stomach to remove stomach contents and control  vomiting.  Not eating for 3 or 4 days. This gives your pancreas a rest, because enzymes are not being produced that can cause further damage.  Antibiotic medicines if your condition is caused by an infection.  Surgery of the pancreas or gallbladder. HOME CARE INSTRUCTIONS   Follow the diet advised by your caregiver. This may involve avoiding alcohol and decreasing the amount of fat in your diet.  Eat smaller, more frequent meals. This reduces the amount of digestive juices the pancreas produces.  Drink enough fluids to keep your urine clear or pale yellow.  Only take over-the-counter or prescription medicines as directed by your caregiver.  Avoid drinking alcohol if it caused your condition.  Do not smoke.  Get plenty of rest.  Check your blood sugar at home as directed by your caregiver.  Keep all follow-up appointments as directed by your caregiver. SEEK MEDICAL CARE IF:   You do not recover as quickly as expected.  You develop new or worsening symptoms.  You have persistent pain, weakness, or nausea.  You recover and then have another episode of pain. SEEK IMMEDIATE MEDICAL CARE IF:   You are unable to eat or keep fluids down.  Your pain becomes severe.  You have a fever or persistent symptoms for more than 2 to 3 days.  You have a fever and your symptoms suddenly get worse.  Your skin or the white part of your eyes turn yellow (jaundice).  You develop vomiting.  You feel dizzy, or you faint.  Your blood sugar is high (over 300 mg/dL). MAKE SURE YOU:   Understand these instructions.  Will watch your condition.  Will get help right away if you are not doing well or get worse.   This information is not intended to replace advice given to you by your health care provider. Make sure you discuss any questions you have with your health care provider.   Document Released: 12/08/2005 Document Revised: 06/08/2012 Document Reviewed: 03/18/2012 Elsevier  Interactive Patient Education 2016 Elsevier Inc.   Low-Fat Diet for Pancreatitis or Gallbladder Conditions A low-fat diet can be helpful if you have pancreatitis or a gallbladder condition. With these conditions, your pancreas and gallbladder have trouble digesting fats. A healthy eating plan with less fat will help rest your pancreas and gallbladder and reduce your symptoms. WHAT DO I NEED TO KNOW ABOUT THIS DIET?  Eat a low-fat diet.  Reduce your fat intake to less than 20-30% of your total daily calories. This is less than 50-60 g of fat per day.  Remember that you need some fat in your diet. Ask your dietician what your daily goal should be.  Choose nonfat and low-fat healthy foods. Look for the words "nonfat," "low fat," or "fat free."  As a guide, look on the label and choose foods with less than 3 g of fat per serving. Eat only one serving.  Avoid alcohol.  Do not smoke. If you need help quitting, talk with your health care provider.  Eat small frequent meals instead of three large heavy meals. WHAT FOODS CAN I EAT? Grains Include healthy grains and starches such as potatoes, wheat bread, fiber-rich cereal, and brown rice. Choose whole grain options whenever possible. In adults, whole grains should account for 45-65% of your daily calories.  Fruits and Vegetables Eat plenty of fruits and vegetables. Fresh fruits and vegetables add fiber to your diet. Meats and Other Protein Sources Eat lean meat such as chicken and pork. Trim any fat off of meat before cooking it. Eggs, fish, and beans are other sources of protein. In adults, these foods should account for 10-35% of your daily calories. Dairy Choose low-fat milk and dairy options. Dairy includes fat and protein, as well as calcium.  Fats and Oils Limit high-fat foods such as fried foods, sweets, baked goods, sugary drinks.  Other Creamy sauces and condiments, such as mayonnaise, can add extra fat. Think about whether or not  you need to use them, or use smaller amounts or low fat options. WHAT FOODS ARE NOT RECOMMENDED?  High fat foods, such as:  [employer].  Ice cream.  [place] toast.  Sweet rolls.  Pizza.  Cheese bread.  Foods covered with batter, butter, creamy sauces, or cheese.  Fried foods.  Sugary drinks and desserts.  Foods that cause gas or bloating   This information is not intended to replace advice given to you by your health care provider. Make sure you discuss any questions you have with your health care provider.   Document Released: 12/13/2013 Document Reviewed: 12/13/2013 Elsevier Interactive Patient Education [employer]

## 2016-04-11 MED FILL — GLIMEPIRIDE 2 MG TABLET: 2 | 30 days supply | Qty: 30 | Fill #1

## 2016-04-14 ENCOUNTER — Other Ambulatory Visit: Payer: Self-pay | Admitting: Gastroenterology

## 2016-04-14 DIAGNOSIS — K859 Acute pancreatitis without necrosis or infection, unspecified: Secondary | ICD-10-CM | POA: Diagnosis not present

## 2016-04-14 DIAGNOSIS — E119 Type 2 diabetes mellitus without complications: Secondary | ICD-10-CM | POA: Diagnosis not present

## 2016-04-14 DIAGNOSIS — R935 Abnormal findings on diagnostic imaging of other abdominal regions, including retroperitoneum: Secondary | ICD-10-CM | POA: Diagnosis not present

## 2016-04-14 MED FILL — OMEPRAZOLE DR 20 MG CAPSULE: 20 | 30 days supply | Qty: 30 | Fill #0

## 2016-04-23 ENCOUNTER — Ambulatory Visit
Admission: RE | Admit: 2016-04-23 | Discharge: 2016-04-23 | Disposition: A | Discharge status: Home / Self Care | Payer: 59 | Source: Ambulatory Visit | Attending: Gastroenterology | Admitting: Gastroenterology

## 2016-04-23 DIAGNOSIS — K859 Acute pancreatitis without necrosis or infection, unspecified: Secondary | ICD-10-CM

## 2016-04-24 ENCOUNTER — Other Ambulatory Visit: Payer: Self-pay | Admitting: Gastroenterology

## 2016-04-24 DIAGNOSIS — R9389 Abnormal findings on diagnostic imaging of other specified body structures: Secondary | ICD-10-CM

## 2016-04-28 ENCOUNTER — Other Ambulatory Visit: Payer: Self-pay | Admitting: Gastroenterology

## 2016-04-29 ENCOUNTER — Encounter (HOSPITAL_COMMUNITY): Payer: Self-pay | Admitting: *Deleted

## 2016-04-29 NOTE — Progress Notes (Signed)
I instructed patient to check CBG to check CBG and if it is less than 70 to treat it with Glucose Gel, Glucose tablets or 1/2 cup of clear juice like apple juice or cranberry juice, or 1/2 cup of regular soda. (not cream soda). I instructed patient to recheck CBG in 15 minutes and if CBG is not greater than 70, to  Call [phone] (pre- op). If it is before pre-op opens to retreat as before and recheck CBG in 15 minutes. I told patient to make note of time that liquid is taken and amount, that surgical time may have to be adjusted.  

## 2016-04-30 ENCOUNTER — Ambulatory Visit (HOSPITAL_COMMUNITY): Payer: 59 | Admitting: Anesthesiology

## 2016-04-30 ENCOUNTER — Encounter (HOSPITAL_COMMUNITY)
Admission: RE | Disposition: A | Discharge status: Home / Self Care | Payer: Self-pay | Source: Ambulatory Visit | Attending: Gastroenterology

## 2016-04-30 ENCOUNTER — Ambulatory Visit (HOSPITAL_COMMUNITY)
Admission: RE | Admit: 2016-04-30 | Discharge: 2016-04-30 | Disposition: A | Discharge status: Home / Self Care | Payer: 59 | Source: Ambulatory Visit | Attending: Gastroenterology | Admitting: Gastroenterology

## 2016-04-30 ENCOUNTER — Encounter (HOSPITAL_COMMUNITY): Payer: Self-pay | Admitting: *Deleted

## 2016-04-30 ENCOUNTER — Inpatient Hospital Stay: Admission: RE | Admit: 2016-04-30 | Payer: 59 | Source: Ambulatory Visit

## 2016-04-30 DIAGNOSIS — R933 Abnormal findings on diagnostic imaging of other parts of digestive tract: Secondary | ICD-10-CM | POA: Insufficient documentation

## 2016-04-30 DIAGNOSIS — K859 Acute pancreatitis without necrosis or infection, unspecified: Secondary | ICD-10-CM | POA: Insufficient documentation

## 2016-04-30 DIAGNOSIS — I10 Essential (primary) hypertension: Secondary | ICD-10-CM | POA: Diagnosis not present

## 2016-04-30 DIAGNOSIS — E119 Type 2 diabetes mellitus without complications: Secondary | ICD-10-CM | POA: Diagnosis not present

## 2016-04-30 DIAGNOSIS — F17210 Nicotine dependence, cigarettes, uncomplicated: Secondary | ICD-10-CM | POA: Insufficient documentation

## 2016-04-30 DIAGNOSIS — Z7982 Long term (current) use of aspirin: Secondary | ICD-10-CM | POA: Diagnosis not present

## 2016-04-30 DIAGNOSIS — Z79899 Other long term (current) drug therapy: Secondary | ICD-10-CM | POA: Insufficient documentation

## 2016-04-30 DIAGNOSIS — Z6841 Body Mass Index (BMI) 40.0 and over, adult: Secondary | ICD-10-CM | POA: Insufficient documentation

## 2016-04-30 DIAGNOSIS — R1013 Epigastric pain: Secondary | ICD-10-CM | POA: Diagnosis not present

## 2016-04-30 DIAGNOSIS — Z7984 Long term (current) use of oral hypoglycemic drugs: Secondary | ICD-10-CM | POA: Diagnosis not present

## 2016-04-30 HISTORY — DX: Furuncle of buttock: L02.32

## 2016-04-30 HISTORY — DX: Unspecified hemorrhoids: K64.9

## 2016-04-30 HISTORY — PX: ESOPHAGOGASTRODUODENOSCOPY: SHX5428

## 2016-04-30 LAB — GLUCOSE, CAPILLARY: Glucose-Capillary: 210 mg/dL — ABNORMAL HIGH (ref 65–99)

## 2016-04-30 SURGERY — EGD (ESOPHAGOGASTRODUODENOSCOPY)
Anesthesia: Monitor Anesthesia Care

## 2016-04-30 MED ORDER — SODIUM CHLORIDE 0.9 % IV SOLN: INTRAVENOUS | Status: DC

## 2016-04-30 MED ORDER — PROPOFOL 10 MG/ML IV BOLUS
INTRAVENOUS | Status: DC | PRN
  Administered 2016-04-30 (×2): 20 mg via INTRAVENOUS
  Administered 2016-04-30: 30 mg via INTRAVENOUS
  Administered 2016-04-30: 20 mg via INTRAVENOUS

## 2016-04-30 MED ORDER — LACTATED RINGERS IV SOLN
INTRAVENOUS | Status: DC
  Administered 2016-04-30: 12:00:00 via INTRAVENOUS
  Administered 2016-04-30: 1000 mL via INTRAVENOUS

## 2016-04-30 MED ORDER — PROPOFOL 500 MG/50ML IV EMUL
INTRAVENOUS | Status: DC | PRN
  Administered 2016-04-30: 100 ug/kg/min via INTRAVENOUS

## 2016-04-30 MED ORDER — LIDOCAINE HCL (CARDIAC) 20 MG/ML IV SOLN
INTRAVENOUS | Status: DC | PRN
  Administered 2016-04-30: 50 mg via INTRATRACHEAL

## 2016-04-30 NOTE — Anesthesia Postprocedure Evaluation (Signed)
Anesthesia Post Note  Patient: Lorayne MarekShawn T Kall  Procedure(s) Performed: Procedure(s) (LRB): ESOPHAGOGASTRODUODENOSCOPY (EGD) (N/A)  Patient location during evaluation: PACU Anesthesia Type: MAC Level of consciousness: awake and alert Pain management: pain level controlled Vital Signs Assessment: post-procedure vital signs reviewed and stable Respiratory status: spontaneous breathing, nonlabored ventilation and respiratory function stable Cardiovascular status: stable and blood pressure returned to baseline Anesthetic complications: no    Last Vitals:  Filed Vitals:   04/30/16 1320 04/30/16 1322  BP: 128/78 136/90  Pulse: 93 86  Temp:    Resp: 22 17    Last Pain: There were no vitals filed for this visit.               Ria Redcay,W. EDMOND

## 2016-04-30 NOTE — Interval H&P Note (Signed)
History and Physical Interval Note:  04/30/2016 12:32 PM  Frederick Grant  has presented today for surgery, with the diagnosis of acute pancreatits  The various methods of treatment have been discussed with the patient and family. After consideration of risks, benefits and other options for treatment, the patient has consented to  Procedure(s): ESOPHAGOGASTRODUODENOSCOPY (EGD) (N/A) as a surgical intervention .  The patient's history has been reviewed, patient examined, no change in status, stable for surgery.  I have reviewed the patient's chart and labs.  Questions were answered to the patient's satisfaction.     Zae Kirtz JR,Chiann Goffredo L

## 2016-04-30 NOTE — Discharge Instructions (Addendum)
Continue the omeprazole  Esophagogastroduodenoscopy, Care After Refer to this sheet in the next few weeks. These instructions provide you with information about caring for yourself after your procedure. Your health care provider may also give you more specific instructions. Your treatment has been planned according to current medical practices, but problems sometimes occur. Call your health care provider if you have any problems or questions after your procedure. WHAT TO EXPECT AFTER THE PROCEDURE After your procedure, it is typical to feel:  Soreness in your throat.  Pain with swallowing.  Sick to your stomach (nauseous).  Bloated.  Dizzy.  Fatigued. HOME CARE INSTRUCTIONS  Do not eat or drink anything until the numbing medicine (local anesthetic) has worn off and your gag reflex has returned. You will know that the local anesthetic has worn off when you can swallow comfortably.  Do not drive or operate machinery until directed by your health care provider.  Take medicines only as directed by your health care provider. SEEK MEDICAL CARE IF:   You cannot stop coughing.  You are not urinating at all or less than usual. SEEK IMMEDIATE MEDICAL CARE IF:  You have difficulty swallowing.  You cannot eat or drink.  You have worsening throat or chest pain.  You have dizziness or lightheadedness or you faint.  You have nausea or vomiting.  You have chills.  You have a fever.  You have severe abdominal pain.  You have black, tarry, or bloody stools.   This information is not intended to replace advice given to you by your health care provider. Make sure you discuss any questions you have with your health care provider.   Document Released: 11/24/2012 Document Revised: 12/29/2014 Document Reviewed: 11/24/2012 Elsevier Interactive Patient Education [employer]2016 Elsevier Inc.

## 2016-04-30 NOTE — Transfer of Care (Signed)
Immediate Anesthesia Transfer of Care Note  Patient: Frederick MarekShawn T Grant  Procedure(s) Performed: Procedure(s): ESOPHAGOGASTRODUODENOSCOPY (EGD) (N/A)  Patient Location: Endoscopy Unit  Anesthesia Type:MAC  Level of Consciousness: awake, alert , oriented and patient cooperative  Airway & Oxygen Therapy: Patient Spontanous Breathing and Patient connected to nasal cannula oxygen  Post-op Assessment: Report given to RN and Post -op Vital signs reviewed and stable  Post vital signs: Reviewed and stable  Last Vitals:  Filed Vitals:   04/30/16 1100 04/30/16 1302  BP: 137/77   Pulse:  102  Temp: 37.1 C   Resp: 15 15    Last Pain: There were no vitals filed for this visit.       Complications: No apparent anesthesia complications

## 2016-04-30 NOTE — Op Note (Signed)
[facility] Patient Name: Frederick Grant Procedure Date : 04/30/2016 MRN: [id number] Attending MD: Tresea MallJames L Ronica Vivian , MD Date of Birth: [birth year] CSN: [id number] Age: 40 Admit Type: Outpatient Procedure:                Upper GI endoscopy Indications:              Epigastric abdominal pain, Abnormal CT of the GI                            tractpatient with clinical pancreatitis and                            thickening around the duodenum on CT scan possibly                            duodenal ulcer. Ultrasound gallbladder negative Providers:                Fayrene FearingJames L. Randa EvensEdwards, MD, Michel BickersLisa R. Straughn, RN, Arlee Muslimhris                            Chandler, Technician Referring MD:              Medicines:                Propofol total dose 175 mg IV, Cetacaine spray Complications:            No immediate complications. Estimated Blood Loss:     Estimated blood loss: none. Procedure:                Pre-Anesthesia Assessment:                           - Prior to the procedure, a History and Physical                            was performed, and patient medications and                            allergies were reviewed. The patient's tolerance of                            previous anesthesia was also reviewed. The risks                            and benefits of the procedure and the sedation                            options and risks were discussed with the patient.                            All questions were answered, and informed consent                            was obtained. Prior Anticoagulants: The patient has  taken no previous anticoagulant or antiplatelet                            agents. ASA Grade Assessment: III - A patient with                            severe systemic disease. After reviewing the risks                            and benefits, the patient was deemed in                            satisfactory condition to undergo the procedure.                            After obtaining informed consent, the endoscope was                            passed under direct vision. Throughout the                            procedure, the patient's blood pressure, pulse, and                            oxygen saturations were monitored continuously. The                            [id number] ([id number]) scope was introduced through the                            mouth, and advanced to the second part of duodenum.                            The upper GI endoscopy was accomplished without                            difficulty. The patient tolerated the procedure                            well. Scope In: Scope Out: Findings:      The esophagus was normal.      The stomach was normal.      The examined duodenum was normal. Impression:               - Normal esophagus.                           - Normal stomach.                           - Normal examined duodenum.                           - No specimens collected. Moderate Sedation:      MAC by anesthesia Recommendation:           -  Patient has a contact number available for                            emergencies. The signs and symptoms of potential                            delayed complications were discussed with the                            patient. Return to normal activities tomorrow.                            Written discharge instructions were provided to the                            patient.                           - Resume previous diet.                           - Continue present medications.                           - Return to endoscopist in 2 months. Procedure Code(s):        --- Professional ---                           [phone], Esophagogastroduodenoscopy, flexible,                            transoral; diagnostic, including collection of                            specimen(s) by brushing or washing, when performed                            (separate  procedure) Diagnosis Code(s):        --- Professional ---                           R93.3, Abnormal findings on diagnostic imaging of                            other parts of digestive tract                           R10.13, Epigastric pain CPT copyright 2016 American Medical Association. All rights reserved. The codes documented in this report are preliminary and upon coder review may  be revised to meet current compliance requirements. Carman Ching, MD Tresea Mall, MD 04/30/2016 1:08:50 PM This report has been signed electronically. Number of Addenda: 0

## 2016-04-30 NOTE — Anesthesia Preprocedure Evaluation (Signed)
Anesthesia Evaluation  Patient identified by MRN, date of birth, ID band Patient awake    Reviewed: Allergy & Precautions, H&P , NPO status , Patient's Chart, lab work & pertinent test results  Airway Mallampati: III  TM Distance: >3 FB Neck ROM: Full    Dental no notable dental hx. (+) Teeth Intact, Dental Advisory Given   Pulmonary Current Smoker,    Pulmonary exam normal breath sounds clear to auscultation       Cardiovascular hypertension, Pt. on medications  Rhythm:Regular Rate:Normal     Neuro/Psych negative neurological ROS  negative psych ROS   GI/Hepatic negative GI ROS, Neg liver ROS,   Endo/Other  diabetes, Type 2, Oral Hypoglycemic AgentsMorbid obesity  Renal/GU negative Renal ROS  negative genitourinary   Musculoskeletal   Abdominal   Peds  Hematology negative hematology ROS (+)   Anesthesia Other Findings   Reproductive/Obstetrics negative OB ROS                             Anesthesia Physical Anesthesia Plan  ASA: III  Anesthesia Plan: MAC   Post-op Pain Management:    Induction: Intravenous  Airway Management Planned: Nasal Cannula  Additional Equipment:   Intra-op Plan:   Post-operative Plan:   Informed Consent: I have reviewed the patients History and Physical, chart, labs and discussed the procedure including the risks, benefits and alternatives for the proposed anesthesia with the patient or authorized representative who has indicated his/her understanding and acceptance.   Dental advisory given  Plan Discussed with: CRNA  Anesthesia Plan Comments:         Anesthesia Quick Evaluation

## 2016-04-30 NOTE — H&P (Signed)
[name] Reason for consult: abdominal pain and history of acute pancreatitis of the clear cause. The patient consumes minimal alcohol and has had a gallbladder ultrasound was normal. There was a questionable duodenal ulcer on CT scan versus inflammation from the pancreatitis. He is being treated with omeprazole and EGD is performed to look for signs of chronic ulcer disease. Referring Physician:   [name] is an 40 y.o. male.    Past Medical History  Diagnosis Date  . Diabetes mellitus without complication ([place])   . Hypertension   . Hemorrhoids   . 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 Tonsillectomy      age 76    Family History  Problem Relation Age of Onset  . Diabetes Mother     Social History:  reports that he has been smoking Cigarettes.  He has a 22 pack-year smoking history. He has never used smokeless tobacco. He reports that he does not drink alcohol or use illicit drugs.  Allergies: No Known Allergies  Medications; Prior to Admission medications   Medication Sig Start Date End Date Taking? Authorizing Provider  aspirin 81 MG tablet Take 81 mg by mouth daily.   Yes Historical Provider, MD  glimepiride (AMARYL) 2 MG tablet Take 1 tablet (2 mg total) by mouth daily before breakfast. 02/20/16  Yes Shawnee Knapp, MD  lisinopril (PRINIVIL,ZESTRIL) 5 MG tablet Take 1 tablet (5 mg total) by mouth daily. 09/11/15  Yes Posey Boyer, MD  metFORMIN (GLUCOPHAGE-XR) 500 MG 24 hr tablet Take 4 tablets (2,000 mg total) by mouth daily with breakfast. Patient taking differently: Take 1,000 mg by mouth 2 (two) times daily.  02/20/16  Yes Shawnee Knapp, MD  omeprazole (PRILOSEC) 20 MG capsule Take 20 mg by mouth daily.   Yes Historical Provider, MD  oxyCODONE-acetaminophen (PERCOCET/ROXICET) 5-325 MG tablet Take 1 tablet by mouth every 4 (four) hours as needed. Patient taking differently: Take 1 tablet by mouth every 4  (four) hours as needed for moderate pain.  04/08/16  Yes Larene Pickett, PA-C  blood glucose meter kit and supplies Dispense based on patient and insurance preference. Use up to four times daily as directed. (FOR ICD-9 250.00, 250.01). Patient not taking: Reported on 04/29/2016 09/11/15   Posey Boyer, MD  hydrochlorothiazide (HYDRODIURIL) 25 MG tablet Take 1 tablet (25 mg total) by mouth daily. Patient not taking: Reported on 04/07/2016 02/20/16   Shawnee Knapp, MD  ondansetron (ZOFRAN ODT) 4 MG disintegrating tablet Take 1 tablet (4 mg total) by mouth every 8 (eight) hours as needed for nausea. Patient not taking: Reported on 04/29/2016 04/08/16   Larene Pickett, PA-C     PRN Meds  Results for orders placed or performed during the hospital encounter of 04/30/16 (from the past 48 hour(s))  Glucose, capillary     Status: Abnormal   Collection Time: 04/30/16 11:14 AM  Result Value Ref Range   Glucose-Capillary 210 (H) 65 - 99 mg/dL    No results found.             Blood pressure 137/77, temperature 98.7 F (37.1 C), temperature source Oral, resp. rate 15, height '5\' 9"'  (1.753 m), weight 133.811 kg (295 lb), SpO2 99 %.     Assessment: 1. Pancreatitis of unclear cause. CT suggests thickening around the duodenum and procedures done to evaluate for possible ulcer disease  Plan: proceed  with EGD today   Jesenya Bowditch JR,Rhylan Gross L 04/30/2016, 12:29 PM   This note was created using voice recognition software and minor errors may Have occurred unintentionally. Pager: [phone] If no answer or after hours call [phone]

## 2016-05-01 ENCOUNTER — Ambulatory Visit
Admission: RE | Admit: 2016-05-01 | Discharge: 2016-05-01 | Disposition: A | Discharge status: Home / Self Care | Payer: 59 | Source: Ambulatory Visit | Attending: Gastroenterology | Admitting: Gastroenterology

## 2016-05-01 DIAGNOSIS — R9389 Abnormal findings on diagnostic imaging of other specified body structures: Secondary | ICD-10-CM

## 2016-05-01 DIAGNOSIS — N2889 Other specified disorders of kidney and ureter: Secondary | ICD-10-CM | POA: Diagnosis not present

## 2016-05-01 MED FILL — METFORMIN HCL ER 500 MG TAB: 500 | 30 days supply | Qty: 120 | Fill #1

## 2016-05-01 MED FILL — LISINOPRIL 5 MG TABLET: 5 | 90 days supply | Qty: 90 | Fill #2

## 2016-05-01 MED FILL — GLIMEPIRIDE 2 MG TABLET: 2 | 30 days supply | Qty: 30 | Fill #2

## 2016-07-10 MED FILL — METFORMIN HCL ER 500 MG TAB: 500 | 30 days supply | Qty: 120 | Fill #2

## 2016-07-10 MED FILL — GLIMEPIRIDE 2 MG TABLET: 2 | 30 days supply | Qty: 30 | Fill #3

## 2016-08-06 ENCOUNTER — Ambulatory Visit (INDEPENDENT_AMBULATORY_CARE_PROVIDER_SITE_OTHER): Payer: 59 | Admitting: Physician Assistant

## 2016-08-06 ENCOUNTER — Encounter: Payer: Self-pay | Admitting: Physician Assistant

## 2016-08-06 VITALS — BP 130/90 | HR 92 | Temp 98.2°F | Resp 17 | Ht 69.0 in | Wt 298.0 lb

## 2016-08-06 DIAGNOSIS — K625 Hemorrhage of anus and rectum: Secondary | ICD-10-CM | POA: Diagnosis not present

## 2016-08-06 LAB — POCT CBC
Granulocyte percent: 63.8 %G (ref 37–80)
HCT, POC: 38.1 % — AB (ref 43.5–53.7)
Hemoglobin: 13 g/dL — AB (ref 14.1–18.1)
Lymph, poc: 2.4 (ref 0.6–3.4)
MCH, POC: 28.6 pg (ref 27–31.2)
MCHC: 34.1 g/dL (ref 31.8–35.4)
MCV: 84 fL (ref 80–97)
MID (CBC): 0.3 (ref 0–0.9)
MPV: 8.2 fL (ref 0–99.8)
PLATELET COUNT, POC: 194 10*3/uL (ref 142–424)
POC Granulocyte: 4.8 (ref 2–6.9)
POC LYMPH %: 31.9 % (ref 10–50)
POC MID %: 4.3 %M (ref 0–12)
RBC: 4.53 M/uL — AB (ref 4.69–6.13)
RDW, POC: 14.7 %
WBC: 7.5 10*3/uL (ref 4.6–10.2)

## 2016-08-06 LAB — COMPLETE METABOLIC PANEL WITH GFR
ALT: 16 U/L (ref 9–46)
AST: 12 U/L (ref 10–40)
Albumin: 4.1 g/dL (ref 3.6–5.1)
Alkaline Phosphatase: 45 U/L (ref 40–115)
BILIRUBIN TOTAL: 0.3 mg/dL (ref 0.2–1.2)
BUN: 15 mg/dL (ref 7–25)
CO2: 27 mmol/L (ref 20–31)
CREATININE: 1.01 mg/dL (ref 0.60–1.35)
Calcium: 9 mg/dL (ref 8.6–10.3)
Chloride: 102 mmol/L (ref 98–110)
GFR, Est African American: >89 mL/min (ref >=60)
GFR, Est Non African American: >89 mL/min (ref >=60)
Glucose, Bld: 209 mg/dL — ABNORMAL HIGH (ref 65–99)
Potassium: 4.4 mmol/L (ref 3.5–5.3)
SODIUM: 137 mmol/L (ref 135–146)
TOTAL PROTEIN: 6.6 g/dL (ref 6.1–8.1)

## 2016-08-06 LAB — POC HEMOCCULT BLD/STL (OFFICE/1-CARD/DIAGNOSTIC): FECAL OCCULT BLD: POSITIVE — AB

## 2016-08-06 MED ORDER — HYDROCORTISONE ACETATE 25 MG RE SUPP: 25.0000 mg | Freq: Two times a day (BID) | RECTAL | 0 refills | Status: AC

## 2016-08-06 MED FILL — ANUCORT-HC 25 MG SUPP: 25 | 6 days supply | Qty: 12 | Fill #0

## 2016-08-06 NOTE — Patient Instructions (Addendum)
This may be from polyps, but we need to have this evaluated by a gastroenterologist.  I will attempt to get your urgently seen.  Please await contact for appointment.  Use the suppository 1-2 times per day in the mean time. If you are having the symptoms below that warrant urgent care, please return.  Gastrointestinal Bleeding Gastrointestinal bleeding is bleeding somewhere along the path that food travels through the body (digestive tract). This path is anywhere between the mouth and the opening of the butt (anus). You may have blood in your throw up (vomit) or in your poop (stools). If there is a lot of bleeding, you may need to stay in the hospital. HOME CARE  Only take medicine as told by your doctor.  Eat foods with fiber such as whole grains, fruits, and vegetables. You can also try eating 1 to 3 prunes a day.  Drink enough fluids to keep your pee (urine) clear or pale yellow. GET HELP RIGHT AWAY IF:   Your bleeding gets worse.  You feel dizzy, weak, or you pass out (faint).  You have bad cramps in your back or belly (abdomen).  You have large blood clumps (clots) in your poop.  Your problems are getting worse. MAKE SURE YOU:   Understand these instructions.  Will watch your condition.  Will get help right away if you are not doing well or get worse.   This information is not intended to replace advice given to you by your health care provider. Make sure you discuss any questions you have with your health care provider.   Document Released: 09/16/2008 Document Revised: 11/24/2012 Document Reviewed: 05/28/2015 Elsevier Interactive Patient Education 2016 [employer]    IF you received an x-ray today, you will receive an invoice from [facility]. Please contact [facility] at [phone] with questions or concerns regarding your invoice.   IF you received labwork today, you will receive an invoice from [facility]. Please  contact [facility] at [phone] with questions or concerns regarding your invoice.   Our billing staff will not be able to assist you with questions regarding bills from these companies.  You will be contacted with the lab results as soon as they are available. The fastest way to get your results is to activate your My Chart account. Instructions are located on the last page of this paperwork. If you have not heard from us regarding the results in 2 weeks, please contact this office.

## 2016-08-06 NOTE — Progress Notes (Addendum)
By signing my name below, I, Mesha Guinyard, attest that this documentation has been prepared under the direction and in the presence of Ivar Drape, MD.  Electronically Signed: Verlee Monte, Medical Scribe. 08/06/16. 8:38 AM.  Subjective:    Patient ID: Frederick Grant, male    DOB: 1976/02/24, 40 y.o.   MRN: [id number]  HPI Chief Complaint  Patient presents with   Blood In Stools    Since Sunday     HPI Comments: Frederick Grant is a 40 y.o. male with a PMHx of HTN , and T2DM who presents to the [facility] complaining of bright red bloody stool with every bm onset 3 days ago. Pt reports finding bright red blood in the commode after a bm. Pt took Preparation H 2 days ago without any relief to his symptoms. Pt took a suppository in the past and found relief before. Pt was told he has internal hemorrhoids by his GI and mentions he's had a hx of bright red bloody stool once every 4-5 months. Pt mentions he went to his GI a few days ago and was told he could have something wrong with his pancrease. Pt drinks once every month, and doesn't binge drink when he does drink. Pt hasn't been lifting heavy objects, and denies finding blood in his undergarments after bm. Pt denies abdominal pain, nausea, dizziness, rectal pain, and constipation.  Patient Active Problem List   Diagnosis Date Noted   BMI 40.0-44.9, adult ([place]) 07/25/2015   Hyperlipidemia 02/06/2014   Diabetes mellitus, type 2 ([place]) 08/04/2013   HTN (hypertension) 08/04/2013   Past Medical History:  Diagnosis Date   Boil of buttock 2012   MRSA   Diabetes mellitus without complication ([place])    Hemorrhoids    Hypertension    Past Surgical History:  Procedure Laterality Date   ESOPHAGOGASTRODUODENOSCOPY N/A 04/30/2016   Procedure: ESOPHAGOGASTRODUODENOSCOPY (EGD);  Surgeon: Laurence Spates, MD;  Location[facility] END[facility]COPY;  Service: Endoscopy;  Laterality: N/A;   TONSILLECTOMY     age 59   No Known  Allergies Prior to Admission medications   Medication Sig Start Date End Date Taking? Authorizing Provider  aspirin 81 MG tablet Take 81 mg by mouth daily.   Yes Historical Provider, MD  blood glucose meter kit and supplies Dispense based on patient and insurance preference. Use up to four times daily as directed. (FOR ICD-9 250.00, 250.01). 09/11/15  Yes Posey Boyer, MD  glimepiride (AMARYL) 2 MG tablet Take 1 tablet (2 mg total) by mouth daily before breakfast. 02/20/16  Yes Shawnee Knapp, MD  hydrochlorothiazide (HYDRODIURIL) 25 MG tablet Take 1 tablet (25 mg total) by mouth daily. 02/20/16  Yes Shawnee Knapp, MD  lisinopril (PRINIVIL,ZESTRIL) 5 MG tablet Take 1 tablet (5 mg total) by mouth daily. 09/11/15  Yes Posey Boyer, MD  metFORMIN (GLUCOPHAGE-XR) 500 MG 24 hr tablet Take 4 tablets (2,000 mg total) by mouth daily with breakfast. Patient taking differently: Take 1,000 mg by mouth 2 (two) times daily.  02/20/16  Yes Shawnee Knapp, MD  oxyCODONE-acetaminophen (PERCOCET/ROXICET) 5-325 MG tablet Take 1 tablet by mouth every 4 (four) hours as needed. Patient not taking: Reported on 08/06/2016 04/08/16   Larene Pickett, PA-C   Social History   Social History   Marital status: Married    Spouse name: N/A   Number of children: N/A   Years of education: N/A   Occupational History   Not on file.  Social History Main Topics   Smoking status: Current Every Day Smoker    Packs/day: 1.00    Years: 22.00    Types: Cigarettes   Smokeless tobacco: Never Used   Alcohol use No   Drug use: No   Sexual activity: Not on file   Other Topics Concern   Not on file   Social History Narrative   No narrative on file   Depression screen [facility] 2/9 08/06/2016 04/07/2016 02/20/2016 09/11/2015 08/01/2015  Decreased Interest 0 0 0 0 0  Down, Depressed, Hopeless 0 0 0 0 0  PHQ - 2 Score 0 0 0 0 0   Review of Systems  Gastrointestinal: Positive for blood in stool. Negative for abdominal pain, constipation,  nausea and rectal pain.  Neurological: Negative for dizziness.  ROS reviewed. Otherwise unremarkable, unless listed above.  Objective:  Physical Exam  Constitutional: He appears well-developed and well-nourished. No distress.  HENT:  Head: Normocephalic and atraumatic.  Eyes: Conjunctivae are normal.  Neck: Neck supple.  Cardiovascular: Normal rate and regular rhythm.  Exam reveals no gallop.   No murmur heard. Pulmonary/Chest: Effort normal.  Abdominal: Soft. Bowel sounds are normal. He exhibits no mass. There is no tenderness. There is no rebound and no guarding.  Genitourinary: Prostate normal.  Genitourinary Comments: External polyp like lesion at the 6 o' clock area There were some swelling as you maneuver the anus, consistent with a non rhomboids hemorrhoids that does extends internally upon rectal exam Endoscopy: there were whitish growths without any sanguinous material/liquid near by. There are possible polyps, or possibly warts.  Neurological: He is alert.  Skin: Skin is warm and dry.  Psychiatric: He has a normal mood and affect. His behavior is normal.  Nursing note and vitals reviewed.  BP 130/90 (BP Location: Left Arm, Patient Position: Sitting, Cuff Size: Large)    Pulse 92    Temp 98.2 F (36.8 C) (Oral)    Resp 17    Ht '5\' 9"'  (1.753 m)    Wt 298 lb (135.2 kg)    SpO2 97%    BMI 44.01 kg/m    Results for orders placed or performed in visit on 08/06/16  POCT CBC  Result Value Ref Range   WBC 7.5 4.6 - 10.2 K/uL   Lymph, poc 2.4 0.6 - 3.4   POC LYMPH PERCENT 31.9 10 - 50 %L   MID (cbc) 0.3 0 - 0.9   POC MID % 4.3 0 - 12 %M   POC Granulocyte 4.8 2 - 6.9   Granulocyte percent 63.8 37 - 80 %G   RBC 4.53 (A) 4.69 - 6.13 M/uL   Hemoglobin 13.0 (A) 14.1 - 18.1 g/dL   HCT, POC 38.1 (A) 43.5 - 53.7 %   MCV 84.0 80 - 97 fL   MCH, POC 28.6 27 - 31.2 pg   MCHC 34.1 31.8 - 35.4 g/dL   RDW, POC 14.7 %   Platelet Count, POC 194 142 - 424 K/uL   MPV 8.2 0 - 99.8 fL  POC  Hemoccult Bld/Stl (1-Cd Office Dx)  Result Value Ref Range   Card #1 Date 08/06/16    Fecal Occult Blood, POC Positive (A) Negative   Assessment & Plan:  40 year old male is here today for cc of rectal bleeding.   --possible polyps are causing this intermittent bleeding.   --at this time, referral to gastroenterology is appreciated at this time.  --he can apply the anusol at this time --  advised of alarming symptoms that warrant immediate return.   Rectal bleeding - Plan: POCT CBC, POC Hemoccult Bld/Stl (1-Cd Office Dx), COMPLETE METABOLIC PANEL WITH GFR, hydrocortisone (ANUSOL-HC) 25 MG suppository, Ambulatory referral to Gastroenterology  Ivar Drape, PA-C [facility] 8/18/20177:52 AM   I personally performed the services described in this documentation, which was scribed in my presence. The recorded information has been reviewed and is accurate.

## 2016-08-20 ENCOUNTER — Telehealth: Payer: Self-pay

## 2016-08-20 NOTE — Telephone Encounter (Signed)
Thanks for letting me know.  Wife was adamant as was I of being seen.  He did state that he was moving.  Please see when the appointment was scheduled by calling patient, and attempt to see why he did not show

## 2016-08-20 NOTE — Telephone Encounter (Signed)
FYI

## 2016-08-20 NOTE — Telephone Encounter (Signed)
Dr. Ross LudwigJames Edward called stated Mr. Frederick Grant was a no show to his referral appointment.

## 2016-08-22 NOTE — Telephone Encounter (Signed)
Pt states it just skipped his mind. He is moving as we speak and he wants to speak with Judeth CornfieldStephanie. He states the medication really helped him.

## 2016-08-27 NOTE — Telephone Encounter (Signed)
Unable to leave message VM not set up.

## 2016-08-27 NOTE — Telephone Encounter (Signed)
If he is moved, can you ask him to give me a contact with a gastroenterologist he would like me to refer to in his new location.  I do recommend him get checked out.

## 2016-09-01 NOTE — Telephone Encounter (Signed)
Spoke to pt and he agreed to try to find the name of a GI near him and call us with info so that we can send in referral for him.

## 2016-09-09 ENCOUNTER — Telehealth: Payer: Self-pay

## 2016-09-09 DIAGNOSIS — E119 Type 2 diabetes mellitus without complications: Secondary | ICD-10-CM

## 2016-09-09 MED FILL — METFORMIN HCL ER 500 MG TAB: 500 | 30 days supply | Qty: 120 | Fill #3

## 2016-09-09 MED FILL — LISINOPRIL 5 MG TABLET: 5 | 90 days supply | Qty: 90 | Fill #3

## 2016-09-09 NOTE — Telephone Encounter (Signed)
Patient leaves out of town tomorrow and wants to now if he can get a refill for glimepiride sent to [facility] out patient [facility]. Please advise!  [phone]

## 2016-09-10 MED ORDER — GLIMEPIRIDE 2 MG PO TABS: 2.0000 mg | ORAL_TABLET | Freq: Every day | ORAL | 0 refills | Status: AC

## 2016-09-10 MED FILL — GLIMEPIRIDE 2 MG TABLET: 2 | 30 days supply | Qty: 30 | Fill #0

## 2016-09-10 NOTE — Telephone Encounter (Signed)
Sent in 1 mos, pt is due for f/up. Notified pt of RF and need for f/up.

## 2017-04-06 IMAGING — CR DG RIBS W/ CHEST 3+V*L*
3 series · 3 of 3 positions shown · non-contrast
Comparison: None.

CLINICAL DATA: Left side rib pain, left flank pain beginning last
night. Cough for 1 week.

EXAM:
LEFT RIBS AND CHEST - 3+ VIEW

[PA]
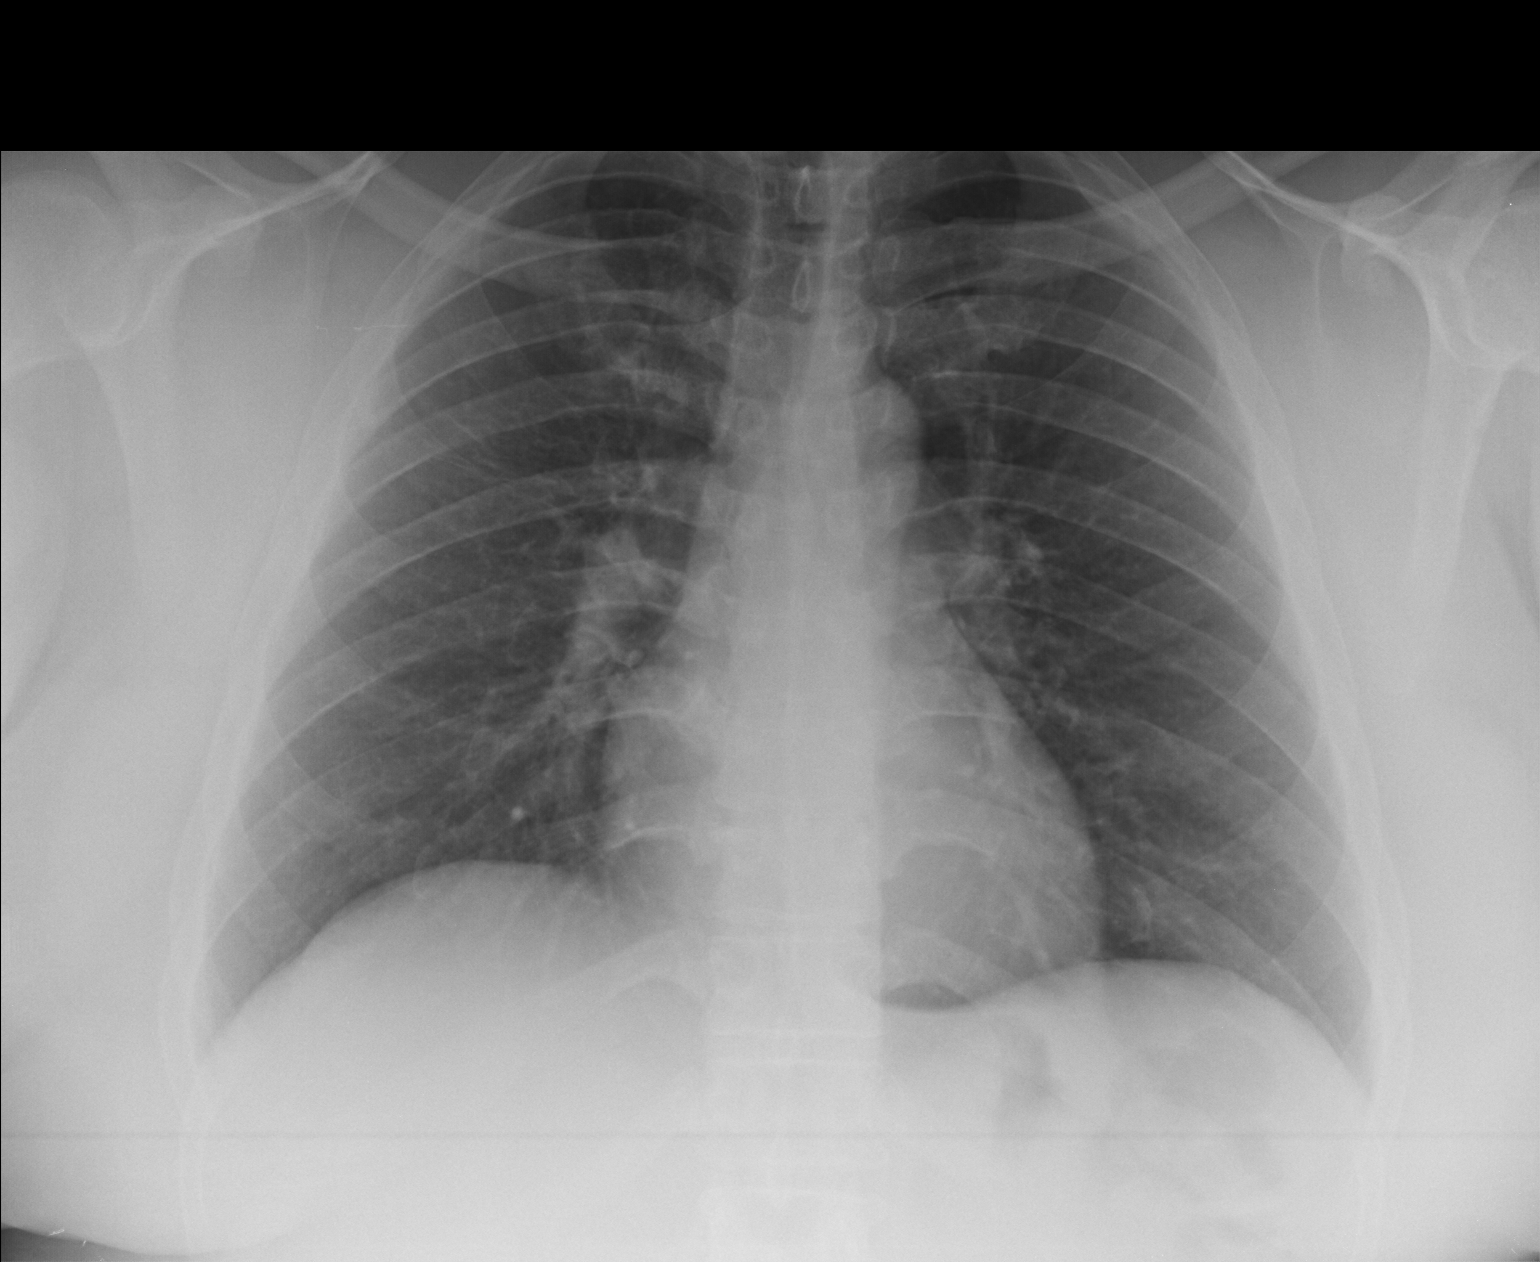

[rao]
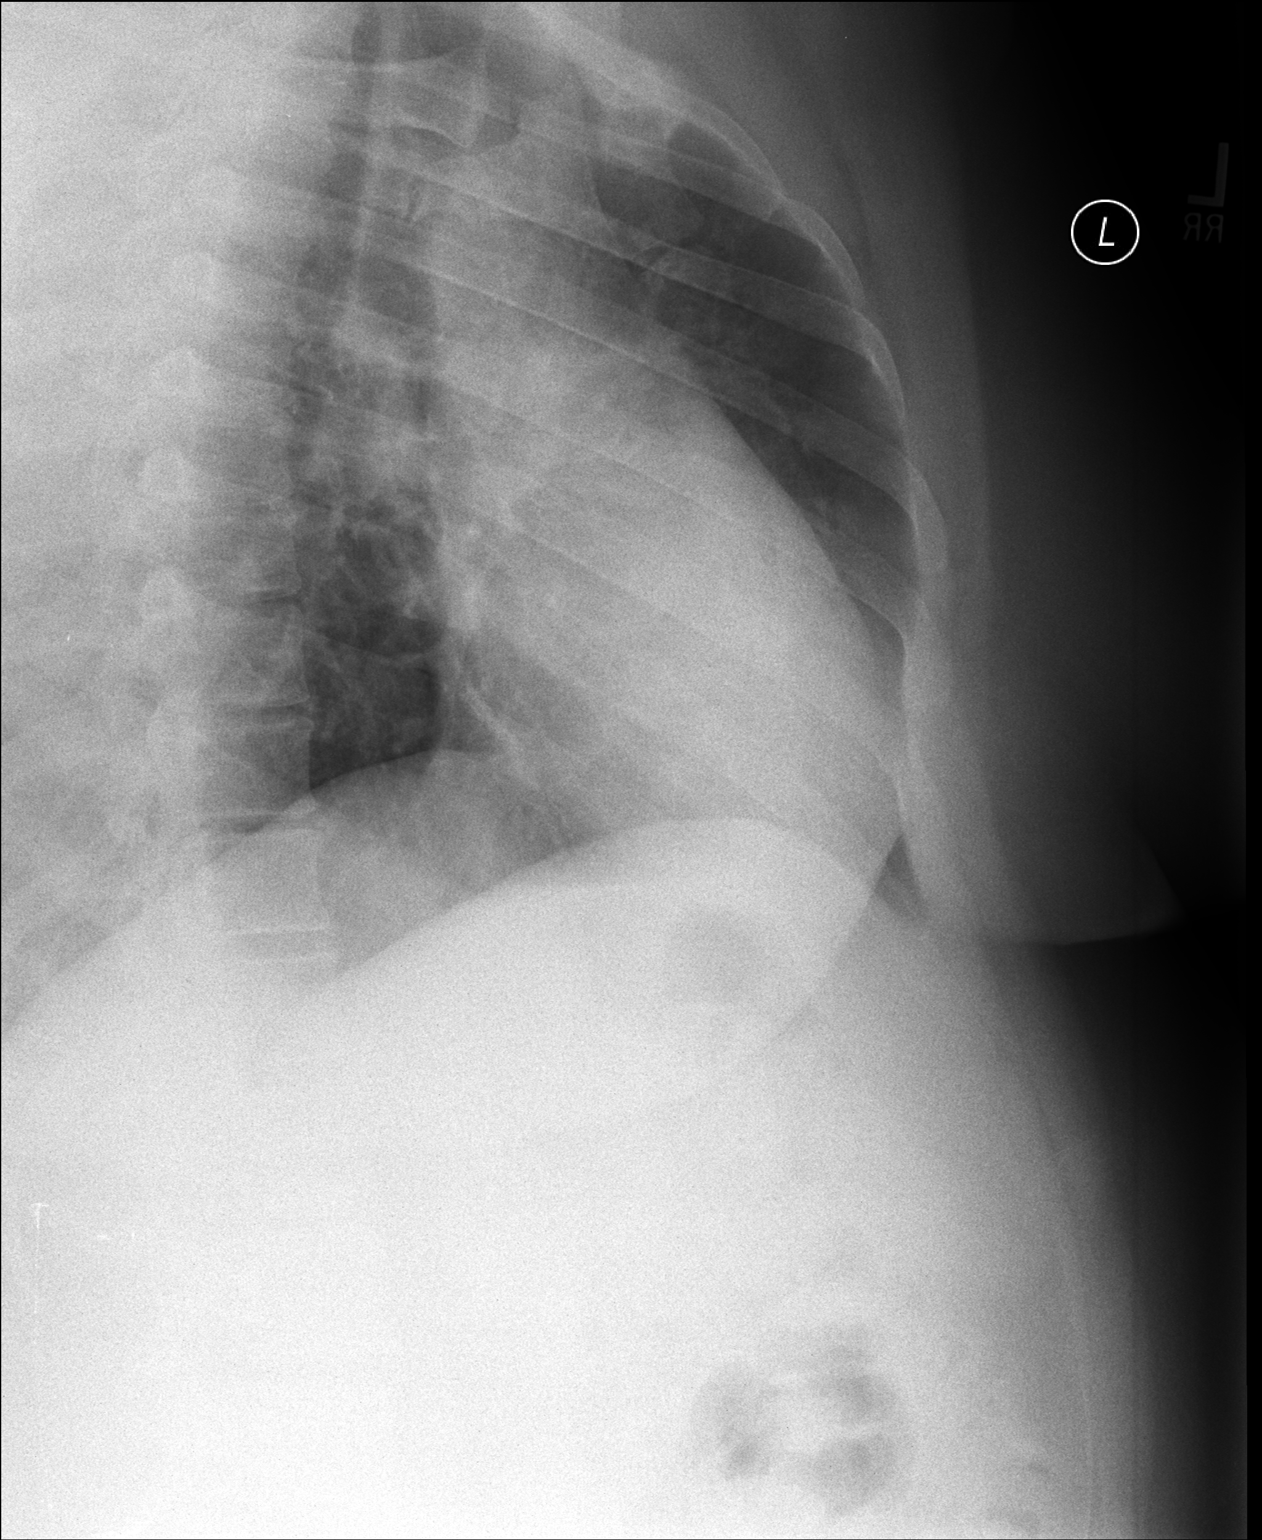

[lao]
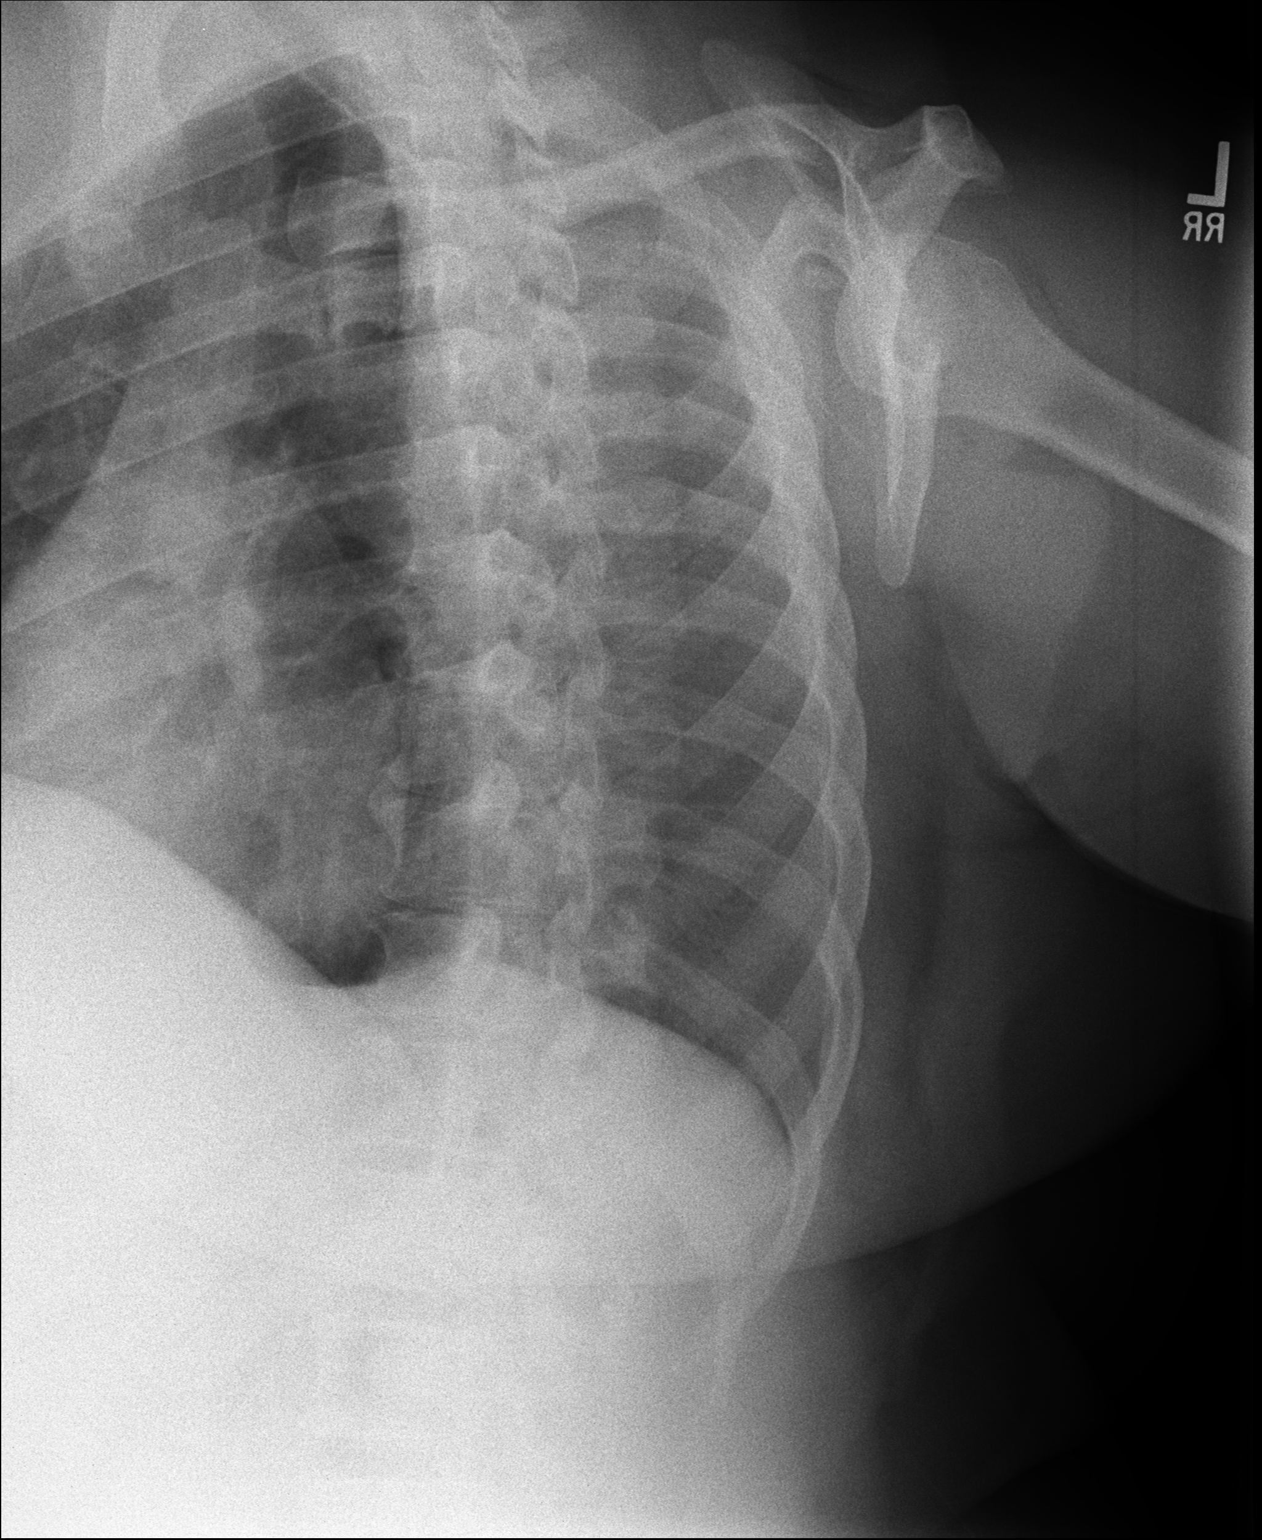

[3 of 3 positions shown; findings below may reference images not displayed]

FINDINGS: No fracture or other bone lesions are seen involving the ribs. There
is no evidence of pneumothorax or pleural effusion. Both lungs are
clear. Heart size and mediastinal contours are within normal limits.
IMPRESSION: Negative.

## 2017-05-23 IMAGING — CR DG ABDOMEN 1V
2 series · 2 of 2 positions shown · non-contrast
Comparison: None.

CLINICAL DATA: Right upper quadrant pain

EXAM:
ABDOMEN - 1 VIEW

[AP (1 of 2)]
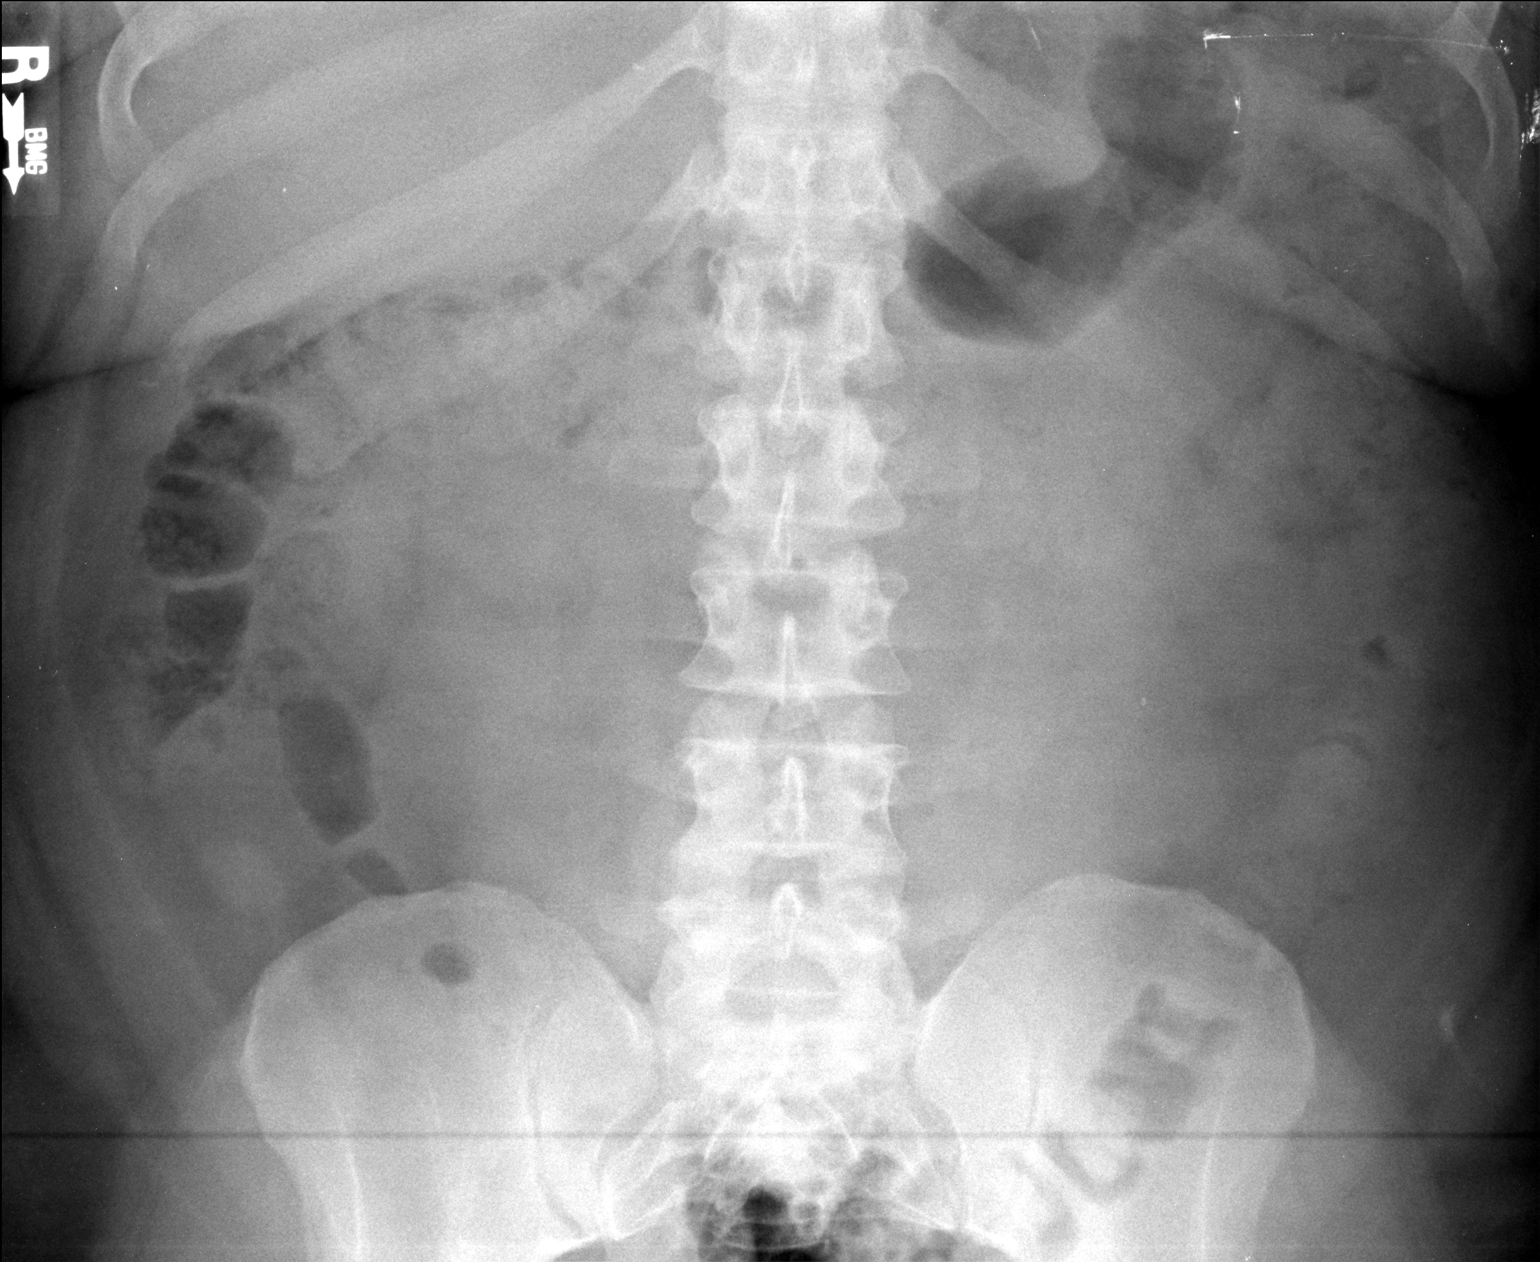

[AP (2 of 2)]
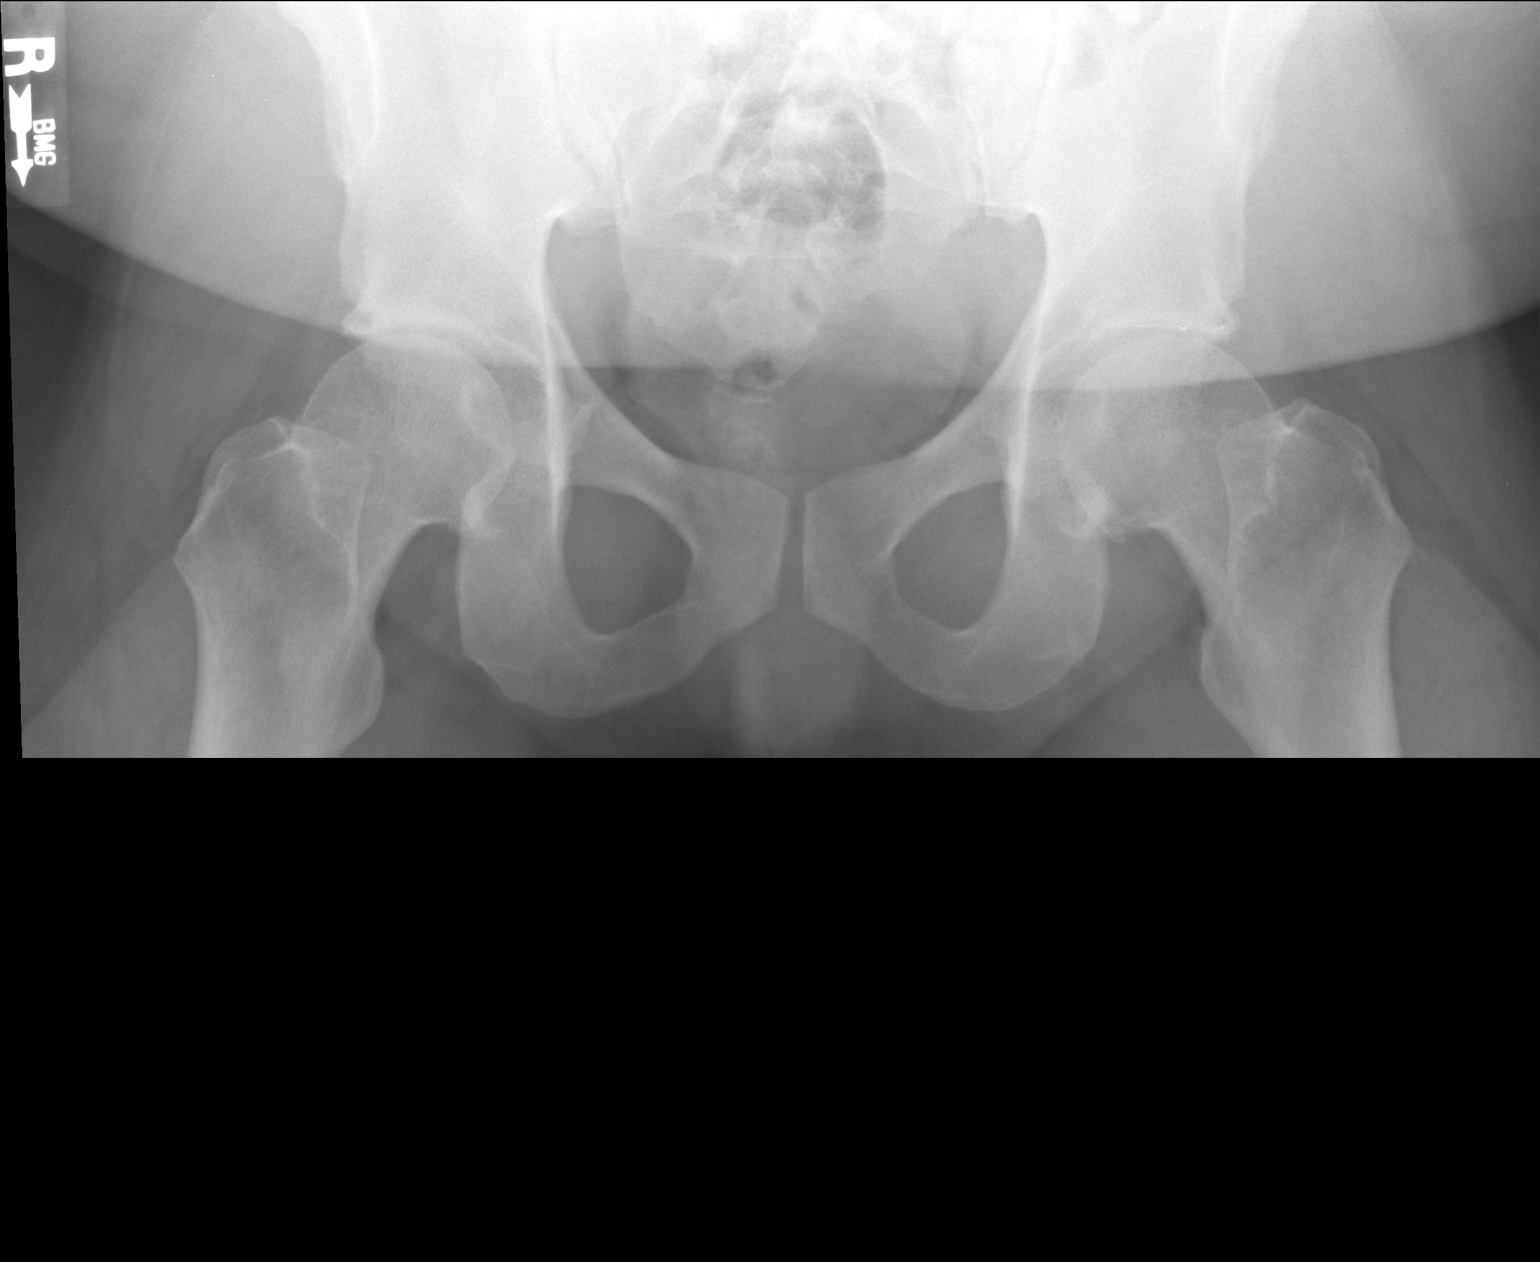

[2 of 2 positions shown; findings below may reference images not displayed]

FINDINGS: Scattered large and small bowel gas is noted. No abnormal mass or
abnormal calcifications are seen. No free air is noted. No acute
bony abnormality is seen.
IMPRESSION: No acute abnormality noted.
